# Patient Record
Sex: Female | Born: 1985 | Race: Black or African American | Hispanic: No | Marital: Single | Smoking: Former smoker
Health system: Southern US, Community
[De-identification: ages and names within clinical notes are randomized; demographics above are authoritative.]

## PROBLEM LIST (undated history)

## (undated) DIAGNOSIS — F419 Anxiety disorder, unspecified: Secondary | ICD-10-CM

## (undated) DIAGNOSIS — K219 Gastro-esophageal reflux disease without esophagitis: Secondary | ICD-10-CM

## (undated) HISTORY — PX: FINGER SURGERY: SHX640

## (undated) HISTORY — PX: EYE SURGERY: SHX253

## (undated) HISTORY — DX: Anxiety disorder, unspecified: F41.9

## (undated) HISTORY — DX: Gastro-esophageal reflux disease without esophagitis: K21.9

---

## 2011-04-16 DIAGNOSIS — K089 Disorder of teeth and supporting structures, unspecified: Secondary | ICD-10-CM | POA: Diagnosis not present

## 2011-04-16 DIAGNOSIS — N912 Amenorrhea, unspecified: Secondary | ICD-10-CM | POA: Diagnosis not present

## 2011-04-16 DIAGNOSIS — F17200 Nicotine dependence, unspecified, uncomplicated: Secondary | ICD-10-CM | POA: Diagnosis not present

## 2011-07-24 DIAGNOSIS — Z0142 Encounter for cervical smear to confirm findings of recent normal smear following initial abnormal smear: Secondary | ICD-10-CM | POA: Diagnosis not present

## 2011-07-24 DIAGNOSIS — Z3040 Encounter for surveillance of contraceptives, unspecified: Secondary | ICD-10-CM | POA: Diagnosis not present

## 2011-08-05 DIAGNOSIS — F17200 Nicotine dependence, unspecified, uncomplicated: Secondary | ICD-10-CM | POA: Diagnosis not present

## 2011-08-05 DIAGNOSIS — A5901 Trichomonal vulvovaginitis: Secondary | ICD-10-CM | POA: Diagnosis not present

## 2012-01-19 DIAGNOSIS — R300 Dysuria: Secondary | ICD-10-CM | POA: Diagnosis not present

## 2012-01-19 DIAGNOSIS — A499 Bacterial infection, unspecified: Secondary | ICD-10-CM | POA: Diagnosis not present

## 2012-01-19 DIAGNOSIS — N390 Urinary tract infection, site not specified: Secondary | ICD-10-CM | POA: Diagnosis not present

## 2012-03-29 DIAGNOSIS — R109 Unspecified abdominal pain: Secondary | ICD-10-CM | POA: Diagnosis not present

## 2012-03-29 DIAGNOSIS — N200 Calculus of kidney: Secondary | ICD-10-CM | POA: Diagnosis not present

## 2012-06-20 DIAGNOSIS — R296 Repeated falls: Secondary | ICD-10-CM | POA: Diagnosis not present

## 2012-06-20 DIAGNOSIS — S63509A Unspecified sprain of unspecified wrist, initial encounter: Secondary | ICD-10-CM | POA: Diagnosis not present

## 2012-07-26 DIAGNOSIS — F17200 Nicotine dependence, unspecified, uncomplicated: Secondary | ICD-10-CM | POA: Diagnosis not present

## 2012-07-26 DIAGNOSIS — N200 Calculus of kidney: Secondary | ICD-10-CM | POA: Diagnosis not present

## 2012-07-26 DIAGNOSIS — R109 Unspecified abdominal pain: Secondary | ICD-10-CM | POA: Diagnosis not present

## 2012-07-28 DIAGNOSIS — R109 Unspecified abdominal pain: Secondary | ICD-10-CM | POA: Diagnosis not present

## 2012-11-03 DIAGNOSIS — Z124 Encounter for screening for malignant neoplasm of cervix: Secondary | ICD-10-CM | POA: Diagnosis not present

## 2012-11-03 DIAGNOSIS — Z01419 Encounter for gynecological examination (general) (routine) without abnormal findings: Secondary | ICD-10-CM | POA: Diagnosis not present

## 2012-11-03 DIAGNOSIS — Z0000 Encounter for general adult medical examination without abnormal findings: Secondary | ICD-10-CM | POA: Diagnosis not present

## 2012-11-03 DIAGNOSIS — N926 Irregular menstruation, unspecified: Secondary | ICD-10-CM | POA: Diagnosis not present

## 2012-11-03 DIAGNOSIS — Z79899 Other long term (current) drug therapy: Secondary | ICD-10-CM | POA: Diagnosis not present

## 2012-11-11 DIAGNOSIS — Z3049 Encounter for surveillance of other contraceptives: Secondary | ICD-10-CM | POA: Diagnosis not present

## 2013-01-26 DIAGNOSIS — N3000 Acute cystitis without hematuria: Secondary | ICD-10-CM | POA: Diagnosis not present

## 2013-08-29 DIAGNOSIS — Z3049 Encounter for surveillance of other contraceptives: Secondary | ICD-10-CM | POA: Diagnosis not present

## 2013-10-01 DIAGNOSIS — N3090 Cystitis, unspecified without hematuria: Secondary | ICD-10-CM | POA: Diagnosis not present

## 2013-10-01 DIAGNOSIS — N390 Urinary tract infection, site not specified: Secondary | ICD-10-CM | POA: Diagnosis not present

## 2013-11-03 DIAGNOSIS — B9689 Other specified bacterial agents as the cause of diseases classified elsewhere: Secondary | ICD-10-CM | POA: Diagnosis not present

## 2013-11-03 DIAGNOSIS — N72 Inflammatory disease of cervix uteri: Secondary | ICD-10-CM | POA: Diagnosis not present

## 2013-11-03 DIAGNOSIS — N760 Acute vaginitis: Secondary | ICD-10-CM | POA: Diagnosis not present

## 2013-11-03 DIAGNOSIS — A499 Bacterial infection, unspecified: Secondary | ICD-10-CM | POA: Diagnosis not present

## 2013-11-10 DIAGNOSIS — N760 Acute vaginitis: Secondary | ICD-10-CM | POA: Diagnosis not present

## 2013-11-10 DIAGNOSIS — N3000 Acute cystitis without hematuria: Secondary | ICD-10-CM | POA: Diagnosis not present

## 2013-11-18 DIAGNOSIS — R300 Dysuria: Secondary | ICD-10-CM | POA: Diagnosis not present

## 2013-11-18 DIAGNOSIS — N390 Urinary tract infection, site not specified: Secondary | ICD-10-CM | POA: Diagnosis not present

## 2014-01-01 DIAGNOSIS — W458XXA Other foreign body or object entering through skin, initial encounter: Secondary | ICD-10-CM | POA: Diagnosis not present

## 2014-01-01 DIAGNOSIS — S0181XA Laceration without foreign body of other part of head, initial encounter: Secondary | ICD-10-CM | POA: Diagnosis not present

## 2014-02-27 DIAGNOSIS — Z202 Contact with and (suspected) exposure to infections with a predominantly sexual mode of transmission: Secondary | ICD-10-CM | POA: Diagnosis not present

## 2014-02-27 DIAGNOSIS — N3000 Acute cystitis without hematuria: Secondary | ICD-10-CM | POA: Diagnosis not present

## 2014-02-27 DIAGNOSIS — Z7251 High risk heterosexual behavior: Secondary | ICD-10-CM | POA: Diagnosis not present

## 2014-07-24 DIAGNOSIS — N760 Acute vaginitis: Secondary | ICD-10-CM | POA: Diagnosis not present

## 2015-01-16 DIAGNOSIS — Z0000 Encounter for general adult medical examination without abnormal findings: Secondary | ICD-10-CM | POA: Diagnosis not present

## 2015-01-16 DIAGNOSIS — Z01419 Encounter for gynecological examination (general) (routine) without abnormal findings: Secondary | ICD-10-CM | POA: Diagnosis not present

## 2015-01-16 DIAGNOSIS — Z6828 Body mass index (BMI) 28.0-28.9, adult: Secondary | ICD-10-CM | POA: Diagnosis not present

## 2015-05-25 DIAGNOSIS — R87810 Cervical high risk human papillomavirus (HPV) DNA test positive: Secondary | ICD-10-CM | POA: Diagnosis not present

## 2015-05-25 DIAGNOSIS — R87629 Unspecified abnormal cytological findings in specimens from vagina: Secondary | ICD-10-CM | POA: Diagnosis not present

## 2015-05-25 DIAGNOSIS — R87610 Atypical squamous cells of undetermined significance on cytologic smear of cervix (ASC-US): Secondary | ICD-10-CM | POA: Diagnosis not present

## 2015-06-01 DIAGNOSIS — N898 Other specified noninflammatory disorders of vagina: Secondary | ICD-10-CM | POA: Diagnosis not present

## 2015-10-29 DIAGNOSIS — Z3009 Encounter for other general counseling and advice on contraception: Secondary | ICD-10-CM | POA: Diagnosis not present

## 2015-10-29 DIAGNOSIS — N898 Other specified noninflammatory disorders of vagina: Secondary | ICD-10-CM | POA: Diagnosis not present

## 2015-10-31 DIAGNOSIS — Z309 Encounter for contraceptive management, unspecified: Secondary | ICD-10-CM | POA: Diagnosis not present

## 2015-10-31 DIAGNOSIS — N898 Other specified noninflammatory disorders of vagina: Secondary | ICD-10-CM | POA: Diagnosis not present

## 2015-12-23 DIAGNOSIS — S161XXA Strain of muscle, fascia and tendon at neck level, initial encounter: Secondary | ICD-10-CM | POA: Diagnosis not present

## 2016-02-12 DIAGNOSIS — Z3202 Encounter for pregnancy test, result negative: Secondary | ICD-10-CM | POA: Diagnosis not present

## 2016-02-12 DIAGNOSIS — N760 Acute vaginitis: Secondary | ICD-10-CM | POA: Diagnosis not present

## 2016-04-09 DIAGNOSIS — N911 Secondary amenorrhea: Secondary | ICD-10-CM | POA: Diagnosis not present

## 2016-04-11 DIAGNOSIS — N911 Secondary amenorrhea: Secondary | ICD-10-CM | POA: Diagnosis not present

## 2016-05-15 DIAGNOSIS — T148XXA Other injury of unspecified body region, initial encounter: Secondary | ICD-10-CM | POA: Diagnosis not present

## 2016-07-07 DIAGNOSIS — R87810 Cervical high risk human papillomavirus (HPV) DNA test positive: Secondary | ICD-10-CM | POA: Diagnosis not present

## 2016-07-07 DIAGNOSIS — R87612 Low grade squamous intraepithelial lesion on cytologic smear of cervix (LGSIL): Secondary | ICD-10-CM | POA: Diagnosis not present

## 2016-07-07 DIAGNOSIS — Z01419 Encounter for gynecological examination (general) (routine) without abnormal findings: Secondary | ICD-10-CM | POA: Diagnosis not present

## 2016-07-21 DIAGNOSIS — N912 Amenorrhea, unspecified: Secondary | ICD-10-CM | POA: Diagnosis not present

## 2016-10-10 ENCOUNTER — Ambulatory Visit (HOSPITAL_COMMUNITY)
Admission: EM | Admit: 2016-10-10 | Discharge: 2016-10-10 | Disposition: A | Discharge status: Home / Self Care | Payer: Medicare Other | Attending: Emergency Medicine | Admitting: Emergency Medicine

## 2016-10-10 ENCOUNTER — Encounter (HOSPITAL_COMMUNITY): Payer: Self-pay | Admitting: Family Medicine

## 2016-10-10 DIAGNOSIS — N760 Acute vaginitis: Secondary | ICD-10-CM | POA: Diagnosis not present

## 2016-10-10 MED ORDER — FLUCONAZOLE 200 MG PO TABS: ORAL_TABLET | ORAL | 0 refills | Status: DC

## 2016-10-10 NOTE — Discharge Instructions (Signed)
You have been screened for multiple types of infection diseases such as Gonorrhea, Chlamydia, bacteria vaginosis, and yeast. You have also been screened for Trichomonas. You will be notified of the results of these tests in 24-72 hours. If any therapy needs to be started or if your current therapy needs to be changed, you will be notified and and given instructions as to what to do. If your symptoms persist or fail to resolve, follow up with your primary care provider or return to clinic.   Based on your signs and symptoms, starting U on Diflucan, take one tablet today, wait 3 days and then take the second tablet

## 2016-10-10 NOTE — ED Triage Notes (Signed)
Pt here for vaginal itching, irritation and odor.

## 2016-10-10 NOTE — ED Provider Notes (Signed)
  [facility]   [id number] 10/10/16 Arrival Time: [place]:  1. Vaginitis and vulvovaginitis     Meds ordered this encounter  Medications  . fluconazole (DIFLUCAN) 200 MG tablet    Sig: Take one tablet today, wait 3 days, take the second tablet    Dispense:  2 tablet    Refill:  0    Order Specific Question:   Supervising Provider    Answer:   Melynda Ripple [[id number]]    Reviewed expectations re: course of current medical issues. Questions answered. Outlined signs and symptoms indicating need for more acute intervention. Patient verbalized understanding. After Visit Summary given. We'll treat with fluconazole, testing for gonorrhea, Trichomonas, BV, yeast, and chlamydia. We'll notify if any other results are positive.   SUBJECTIVE:  Monica Davidson is a 31 y.o. female who presents with complaint of Vaginal itching and odor for 3 days. She has no pelvic or abdominal pain, or pain with intercourse. No nausea, vomiting, fever, chills, or lymphadenopathy. She is sexually active, and did not use a condom with her last encounter. She has no abnormal menstrual issues, or dysuria.  ROS: As per HPI, otherwise negative.   OBJECTIVE:  Vitals:   10/10/16 1302  BP: 105/65  Pulse: (!) 51  Resp: 18  Temp: 97.8 F (36.6 C)  SpO2: 99%     General appearance: alert; no distress HEENT: normocephalic; atraumatic; conjunctivae normal; External ears normal Abdomen: soft, non-tender; no masses or organomegaly; no guarding or rebound tenderness Back: no CVA tenderness GU: Deferred, urine cytology obtained Extremities: no cyanosis or edema; symmetrical with no gross deformities Skin: warm and dry Neurologic: normal symmetric reflexes; normal gait Psychological:  alert and cooperative; normal mood and affect  No results found for this or any previous visit.  Labs Reviewed  URINE CYTOLOGY ANCILLARY ONLY    No results found.  No Known Allergies  PMHx, SurgHx,  SocialHx, Medications, and Allergies were reviewed in the Visit Navigator and updated as appropriate.      Barnet Glasgow, NP 10/10/16 1544

## 2016-10-13 ENCOUNTER — Telehealth (HOSPITAL_COMMUNITY): Payer: Self-pay | Admitting: *Deleted

## 2016-10-13 LAB — URINE CYTOLOGY ANCILLARY ONLY
Chlamydia: POSITIVE — AB
Neisseria Gonorrhea: NEGATIVE
Trichomonas: NEGATIVE

## 2016-10-13 MED ORDER — AZITHROMYCIN 500 MG PO TABS
1000.0000 mg | ORAL_TABLET | Freq: Once | ORAL | 0 refills | Status: AC
Start: 2016-10-13 — End: 2016-10-13

## 2016-10-13 NOTE — Telephone Encounter (Signed)
STD results given to patient. Education completed. Antibiotic sent to pharmacy of choice.   Information sent to [facility] Department.

## 2016-10-16 LAB — URINE CYTOLOGY ANCILLARY ONLY: CANDIDA VAGINITIS: NEGATIVE

## 2016-11-05 ENCOUNTER — Ambulatory Visit (HOSPITAL_COMMUNITY)
Admission: EM | Admit: 2016-11-05 | Discharge: 2016-11-05 | Disposition: A | Discharge status: Home / Self Care | Payer: Medicare Other | Attending: Family Medicine | Admitting: Family Medicine

## 2016-11-05 ENCOUNTER — Encounter (HOSPITAL_COMMUNITY): Payer: Self-pay | Admitting: Nurse Practitioner

## 2016-11-05 DIAGNOSIS — N3000 Acute cystitis without hematuria: Secondary | ICD-10-CM | POA: Diagnosis not present

## 2016-11-05 DIAGNOSIS — L298 Other pruritus: Secondary | ICD-10-CM | POA: Insufficient documentation

## 2016-11-05 DIAGNOSIS — N898 Other specified noninflammatory disorders of vagina: Secondary | ICD-10-CM | POA: Diagnosis not present

## 2016-11-05 DIAGNOSIS — R300 Dysuria: Secondary | ICD-10-CM | POA: Diagnosis not present

## 2016-11-05 DIAGNOSIS — Z3202 Encounter for pregnancy test, result negative: Secondary | ICD-10-CM | POA: Diagnosis not present

## 2016-11-05 LAB — POCT PREGNANCY, URINE: PREG TEST UR: NEGATIVE

## 2016-11-05 LAB — POCT URINALYSIS DIP (DEVICE)
BILIRUBIN URINE: NEGATIVE
Glucose, UA: NEGATIVE mg/dL
Hgb urine dipstick: NEGATIVE
KETONES UR: NEGATIVE mg/dL
NITRITE: NEGATIVE
Protein, ur: NEGATIVE mg/dL
Specific Gravity, Urine: >=1.03 (ref 1.005–1.030)
Urobilinogen, UA: 1 mg/dL (ref 0.0–1.0)
pH: 6.5 (ref 5.0–8.0)

## 2016-11-05 MED ORDER — FLUCONAZOLE 150 MG PO TABS: ORAL_TABLET | ORAL | 0 refills | Status: DC

## 2016-11-05 MED ORDER — SULFAMETHOXAZOLE-TRIMETHOPRIM 800-160 MG PO TABS: 1.0000 | ORAL_TABLET | Freq: Two times a day (BID) | ORAL | 0 refills | Status: AC

## 2016-11-05 NOTE — ED Triage Notes (Signed)
Pt presents with c/o vaginal itching. The itching began about 2 days ago. She also reports lower abdominal pain, dysuria. She denies fevers, nausea, vomiting, vaginal discharge.

## 2016-11-06 ENCOUNTER — Telehealth (HOSPITAL_COMMUNITY): Payer: Self-pay | Admitting: Internal Medicine

## 2016-11-06 LAB — URINE CYTOLOGY ANCILLARY ONLY
CHLAMYDIA, DNA PROBE: NEGATIVE
NEISSERIA GONORRHEA: NEGATIVE
Trichomonas: POSITIVE — AB

## 2016-11-06 MED ORDER — METRONIDAZOLE 500 MG PO TABS: 2000.0000 mg | ORAL_TABLET | Freq: Once | ORAL | 0 refills | Status: AC

## 2016-11-06 NOTE — ED Provider Notes (Signed)
  [place] [facility]   [id number] 11/05/16 Arrival Time: [place]:  Today you were diagnosed with the following: 1. Vaginal itching   2. Dysuria   3. Acute cystitis without hematuria     Meds ordered this encounter  Medications  . sulfamethoxazole-trimethoprim (BACTRIM DS,SEPTRA DS) 800-160 MG tablet    Sig: Take 1 tablet by mouth 2 (two) times daily.    Dispense:  6 tablet    Refill:  0  . fluconazole (DIFLUCAN) 150 MG tablet    Sig: Take one tablet by mouth as a single dose.    Dispense:  1 tablet    Refill:  0   Urine cytology sent. Will notify when results are available. Treating for UTI and possible yeast infection. Urine culture sent. Refrain from any sexual activity until urine cytology results are available. She may return here as needed.  Reviewed expectations re: course of current medical issues. Questions answered. Outlined signs and symptoms indicating need for more acute intervention. Patient verbalized understanding. After Visit Summary given.   SUBJECTIVE:  Monica Davidson is a 31 y.o. female who presents with complaint of vaginal itching for 2 days. Gradual onset. Mild dysuria. No pelvic or abdominal pain. No vaginal discharge. No n/v. Ambulatory without difficulty. Afebrile. Sexually active with one partner. Occasional condom use. No PMH of STI reported. Patient's last menstrual period was 11/02/2016.  ROS: As per HPI.   OBJECTIVE:  Vitals:   11/05/16 1908  BP: 109/68  Pulse: (!) 56  Resp: 16  Temp: 98.4 F (36.9 C)  TempSrc: Oral  SpO2: 98%     General appearance: alert, cooperative, appears stated age and no distress Throat: lips, mucosa, and tongue normal; teeth and gums normal Back: no CVA tenderness Abdomen: soft, non-tender; bowel sounds normal; no masses or organomegaly; no guarding or rebound tenderness GU: declines Skin: warm and dry Psychological:  Alert and cooperative. Normal mood and affect.  Results for orders  placed or performed during the hospital encounter of 11/05/16  POCT urinalysis dip (device)  Result Value Ref Range   Glucose, UA NEGATIVE NEGATIVE mg/dL   Bilirubin Urine NEGATIVE NEGATIVE   Ketones, ur NEGATIVE NEGATIVE mg/dL   Specific Gravity, Urine >=1.030 1.005 - 1.030   Hgb urine dipstick NEGATIVE NEGATIVE   pH 6.5 5.0 - 8.0   Protein, ur NEGATIVE NEGATIVE mg/dL   Urobilinogen, UA 1.0 0.0 - 1.0 mg/dL   Nitrite NEGATIVE NEGATIVE   Leukocytes, UA SMALL (A) NEGATIVE  Pregnancy, urine POC  Result Value Ref Range   Preg Test, Ur NEGATIVE NEGATIVE    Labs Reviewed  POCT URINALYSIS DIP (DEVICE) - Abnormal; Notable for the following:       Result Value   Leukocytes, UA SMALL (*)    All other components within normal limits  URINE CULTURE  POCT PREGNANCY, URINE  URINE CYTOLOGY ANCILLARY ONLY    No Known Allergies  History reviewed. No pertinent surgical history.        Vanessa Kick, MD 11/06/16 [phone]

## 2016-11-06 NOTE — Telephone Encounter (Signed)
clinical staff, please let patient know that test for trichomonas was positive.  Rx metronidazole was sent to the pharmacy of record, [facility] on [address] at [address]  Please refrain from sexual intercourse for 7 days to give the medicine time to work.  Sexual partners need to be notified and tested/treated.  Condoms may reduce risk of reinfection.  Recheck for further evaluation if symptoms are not improving.   LM

## 2016-11-07 LAB — URINE CULTURE

## 2016-11-11 LAB — URINE CYTOLOGY ANCILLARY ONLY

## 2017-02-13 DIAGNOSIS — R87612 Low grade squamous intraepithelial lesion on cytologic smear of cervix (LGSIL): Secondary | ICD-10-CM | POA: Diagnosis not present

## 2017-02-13 DIAGNOSIS — N870 Mild cervical dysplasia: Secondary | ICD-10-CM | POA: Diagnosis not present

## 2017-04-11 ENCOUNTER — Encounter (HOSPITAL_COMMUNITY): Payer: Self-pay

## 2017-04-11 ENCOUNTER — Ambulatory Visit (HOSPITAL_COMMUNITY)
Admission: EM | Admit: 2017-04-11 | Discharge: 2017-04-11 | Disposition: A | Discharge status: Home / Self Care | Payer: Medicare Other | Attending: Family Medicine | Admitting: Family Medicine

## 2017-04-11 ENCOUNTER — Other Ambulatory Visit: Payer: Self-pay

## 2017-04-11 DIAGNOSIS — L298 Other pruritus: Secondary | ICD-10-CM | POA: Diagnosis not present

## 2017-04-11 DIAGNOSIS — N760 Acute vaginitis: Secondary | ICD-10-CM | POA: Diagnosis not present

## 2017-04-11 DIAGNOSIS — F17200 Nicotine dependence, unspecified, uncomplicated: Secondary | ICD-10-CM | POA: Diagnosis not present

## 2017-04-11 DIAGNOSIS — Z3202 Encounter for pregnancy test, result negative: Secondary | ICD-10-CM | POA: Diagnosis not present

## 2017-04-11 LAB — POCT URINALYSIS DIP (DEVICE)
Bilirubin Urine: NEGATIVE
Glucose, UA: NEGATIVE mg/dL
Hgb urine dipstick: NEGATIVE
Ketones, ur: NEGATIVE mg/dL
LEUKOCYTES UA: NEGATIVE
NITRITE: NEGATIVE
PH: 5.5 (ref 5.0–8.0)
Protein, ur: NEGATIVE mg/dL
Specific Gravity, Urine: >=1.03 (ref 1.005–1.030)
Urobilinogen, UA: 1 mg/dL (ref 0.0–1.0)

## 2017-04-11 LAB — POCT PREGNANCY, URINE: PREG TEST UR: NEGATIVE

## 2017-04-11 MED ORDER — FLUCONAZOLE 150 MG PO TABS: ORAL_TABLET | ORAL | 0 refills | Status: DC

## 2017-04-11 NOTE — ED Triage Notes (Addendum)
Patient presents to [facility] for possible yeast infection, pt sttaes about 2-3 days ago she had vaginal discharge that was thick and white with slight odor but now has gone away, pt is requesting STD testing

## 2017-04-11 NOTE — Discharge Instructions (Addendum)
Your pregnancy test was negative, urine showed no signs of infection or blood.  We will go ahead and treat you for yeast infection with Diflucan.  Take 1 tablet. Please refrain from sexual intercourse for 7 days while medicines eliminating infection.   We are testing you for Gonorrhea, Chlamydia, Trichomonas, Yeast and Bacterial Vaginosis. We will call you if anything is positive and let you know if you require any further treatment. Please inform partners of any positive results.   Please return if symptoms not improving with treatment, development of fever, nausea, vomiting, abdominal pain.

## 2017-04-12 NOTE — ED Provider Notes (Signed)
[place]    CSN: [id number] Arrival date & time: 04/11/17  1617     History   Chief Complaint Chief Complaint  Patient presents with  . Vaginitis    HPI Monica Davidson is a 32 y.o. female no significant medical history presenting today with concern of vaginal itching.  She states that itching has been going on for 2 weeks.  Initially she thought it was related to the specific steps she was using.  But itching has persisted and gotten worse.  Initially she had thick white discharge but this has decreased.  Denies urinary symptoms of dysuria, increased frequency; does admit to some sensations of incomplete voiding.  She was using Depo-Provera for birth control but has not had her most recent shot.  Last menstrual period was 2 weeks ago.  Not currently with any form of birth control.  She does state that she has had yeast and BV infections in the past.  Denies fever, abdominal pain, back pain, nausea, vomiting.  HPI  History reviewed. No pertinent past medical history.  There are no active problems to display for this patient.   History reviewed. No pertinent surgical history.  OB History    No data available       Home Medications    Prior to Admission medications   Medication Sig Start Date End Date Taking? Authorizing Provider  fluconazole (DIFLUCAN) 150 MG tablet Take one tablet by mouth as a single dose. 04/11/17   Wieters, Elesa Hacker, PA-C    Family History History reviewed. No pertinent family history.  Social History Social History   Tobacco Use  . Smoking status: Current Some Day Smoker  . Smokeless tobacco: Never Used  Substance Use Topics  . Alcohol use: Yes  . Drug use: No     Allergies   Patient has no known allergies.   Review of Systems Review of Systems  Constitutional: Negative for fever.  Respiratory: Negative for shortness of breath.   Cardiovascular: Negative for chest pain.  Gastrointestinal: Negative for abdominal pain,  diarrhea, nausea and vomiting.  Genitourinary: Positive for vaginal discharge. Negative for dysuria, flank pain, genital sores, hematuria, menstrual problem, vaginal bleeding and vaginal pain.       Vaginal pruritis  Musculoskeletal: Negative for back pain.  Skin: Negative for rash.  Neurological: Negative for dizziness, light-headedness and headaches.     Physical Exam Triage Vital Signs ED Triage Vitals [04/11/17 1708]  Enc Vitals Group     BP 99/62     Pulse Rate 61     Resp 16     Temp 98.2 F (36.8 C)     Temp Source Oral     SpO2 100 %     Weight      Height      Head Circumference      Peak Flow      Pain Score      Pain Loc      Pain Edu?      Excl. in [place]?    No data found.  Updated Vital Signs BP 99/62 (BP Location: Left Arm)   Pulse 61   Temp 98.2 F (36.8 C) (Oral)   Resp 16   LMP 03/24/2017 (Approximate)   SpO2 100%   Visual Acuity Right Eye Distance:   Left Eye Distance:   Bilateral Distance:    Right Eye Near:   Left Eye Near:    Bilateral Near:     Physical Exam  Constitutional:  She appears well-developed and well-nourished. No distress.  HENT:  Head: Normocephalic and atraumatic.  Eyes: Conjunctivae are normal.  Neck: Neck supple.  Cardiovascular: Normal rate and regular rhythm.  No murmur heard. Pulmonary/Chest: Effort normal and breath sounds normal. No respiratory distress.  Abdominal: Soft. There is no tenderness.  Nontender to light and deep palpation  Genitourinary:  Genitourinary Comments: Pelvic exam deferred as patient without any pelvic pain or new rashes/lesions.  Musculoskeletal: She exhibits no edema.  Neurological: She is alert.  Skin: Skin is warm and dry.  Psychiatric: She has a normal mood and affect.  Nursing note and vitals reviewed.    UC Treatments / Results  Labs (all labs ordered are listed, but only abnormal results are displayed) Labs Reviewed  POCT URINALYSIS DIP (DEVICE)  POCT PREGNANCY, URINE    CERVICOVAGINAL ANCILLARY ONLY    EKG  EKG Interpretation None       Radiology No results found.  Procedures Procedures (including critical care time)  Medications Ordered in UC Medications - No data to display   Initial Impression / Assessment and Plan / UC Course  I have reviewed the triage vital signs and the nursing notes.  Pertinent labs & imaging results that were available during my care of the patient were reviewed by me and considered in my medical decision making (see chart for details).     Patient with  Vaginal itching.  UA negative for signs of infection and blood.  Pregnancy test negative.  Screening for STD's performed, will call to inform of positive results.  We will go ahead and treat for yeast infection with Diflucan.  Patient lacking fever, nausea, vomiting, abdominal pain, CMT; unlikely PID. Discussed strict return precautions. Patient verbalized understanding and is agreeable with plan.    Final Clinical Impressions(s) / UC Diagnoses   Final diagnoses:  Vaginitis and vulvovaginitis    ED Discharge Orders        Ordered    fluconazole (DIFLUCAN) 150 MG tablet     04/11/17 1728       Controlled Substance Prescriptions [place] Controlled Substance Registry consulted? Not Applicable   Janith Lima, [place] 04/12/17 [phone]

## 2017-04-13 LAB — CERVICOVAGINAL ANCILLARY ONLY
Bacterial vaginitis: POSITIVE — AB
CANDIDA VAGINITIS: NEGATIVE
Chlamydia: NEGATIVE
Neisseria Gonorrhea: NEGATIVE
Trichomonas: NEGATIVE

## 2017-04-18 ENCOUNTER — Telehealth (HOSPITAL_COMMUNITY): Payer: Self-pay | Admitting: Emergency Medicine

## 2017-04-18 MED ORDER — METRONIDAZOLE 500 MG PO TABS: 500.0000 mg | ORAL_TABLET | Freq: Two times a day (BID) | ORAL | 0 refills | Status: DC

## 2017-04-18 NOTE — Telephone Encounter (Signed)
-----   Message from Wynona Luna, MD sent at 04/14/2017  9:19 PM EST ----- Clinical staff, please let patient know that test for gardnerella (bacterial vaginosis) was positive.  This only needs to be treated if there are persistent symptoms, such as vaginal irritation/discharge.   If these symptoms are present, ok to send rx for metronidazole 500mg  bid x 7d #14 no refills or metronidazole vaginal gel 3.81% 1 applicatorful bid x 7d #84 no refills.  Recheck for further evaluation if symptoms are not improving.  LM

## 2017-04-18 NOTE — Telephone Encounter (Signed)
Called pt to notify of recent lab results.... Pt ID'd properly Reports feeling better and sx are subsiding Pt is taking/tolarating well meds given at visit.  Reports she wants [place] to call in Flagyl to [facility] on [address] even though sx have subsided. Rx sent per her request.  Adv pt if sx are not getting better to return or to f/u w/PCP Education on safe sex given Notified pt that lab results can be obtained through MyChart Pt verb understanding.

## 2017-12-15 ENCOUNTER — Ambulatory Visit (HOSPITAL_COMMUNITY)
Admission: EM | Admit: 2017-12-15 | Discharge: 2017-12-15 | Disposition: A | Discharge status: Home / Self Care | Payer: Medicare Other | Attending: Family Medicine | Admitting: Family Medicine

## 2017-12-15 ENCOUNTER — Encounter (HOSPITAL_COMMUNITY): Payer: Self-pay | Admitting: Emergency Medicine

## 2017-12-15 ENCOUNTER — Ambulatory Visit (HOSPITAL_COMMUNITY)
Admission: EM | Admit: 2017-12-15 | Discharge: 2017-12-15 | Disposition: A | Discharge status: Home / Self Care | Payer: Medicare Other

## 2017-12-15 DIAGNOSIS — Z3202 Encounter for pregnancy test, result negative: Secondary | ICD-10-CM

## 2017-12-15 DIAGNOSIS — F17200 Nicotine dependence, unspecified, uncomplicated: Secondary | ICD-10-CM | POA: Diagnosis not present

## 2017-12-15 DIAGNOSIS — N3090 Cystitis, unspecified without hematuria: Secondary | ICD-10-CM | POA: Diagnosis not present

## 2017-12-15 LAB — POCT PREGNANCY, URINE: Preg Test, Ur: NEGATIVE

## 2017-12-15 LAB — POCT URINALYSIS DIP (DEVICE)
Bilirubin Urine: NEGATIVE
Glucose, UA: NEGATIVE mg/dL
KETONES UR: NEGATIVE mg/dL
Nitrite: POSITIVE — AB
PH: 6.5 (ref 5.0–8.0)
PROTEIN: 30 mg/dL — AB
SPECIFIC GRAVITY, URINE: 1.025 (ref 1.005–1.030)
Urobilinogen, UA: 1 mg/dL (ref 0.0–1.0)

## 2017-12-15 MED ORDER — CEPHALEXIN 500 MG PO CAPS: 500.0000 mg | ORAL_CAPSULE | Freq: Two times a day (BID) | ORAL | 0 refills | Status: DC

## 2017-12-15 NOTE — ED Provider Notes (Signed)
[name]    ASSESSMENT & PLAN:  1. Cystitis     Meds ordered this encounter  Medications  . cephALEXin (KEFLEX) 500 MG capsule    Sig: Take 1 capsule (500 mg total) by mouth 2 (two) times daily.    Dispense:  10 capsule    Refill:  0   Urine culture sent. Will notify patient when results available. Will follow up with her PCP or here if not showing improvement over the next 48 hours, sooner if needed.  Outlined signs and symptoms indicating need for more acute intervention. Patient verbalized understanding. After Visit Summary given.  SUBJECTIVE:  Monica Davidson is a 32 y.o. female who complains of urinary frequency, urgency and dysuria for the past 1 day. No flank pain, fever, chills, abnormal vaginal discharge or bleeding. Hematuria: not present. Normal PO intake. No abdominal pain. No self treatment. Ambulatory without difficulty.  ROS: As in HPI.  OBJECTIVE:  Vitals:   12/15/17 1334  BP: (!) 107/55  Pulse: 60  Resp: 18  Temp: 97.6 F (36.4 C)  TempSrc: Oral  SpO2: 100%   Appears well, in no apparent distress. Abdomen is soft without tenderness, guarding, mass, rebound or organomegaly. No CVA tenderness or inguinal adenopathy noted.  Labs Reviewed  POCT URINALYSIS DIP (DEVICE) - Abnormal; Notable for the following components:      Result Value   Hgb urine dipstick MODERATE (*)    Protein, ur 30 (*)    Nitrite POSITIVE (*)    Leukocytes, UA SMALL (*)    All other components within normal limits  URINE CULTURE  POCT PREGNANCY, URINE    No Known Allergies  History reviewed. No pertinent past medical history. Social History   Socioeconomic History  . Marital status: Single    Spouse name: Not on file  . Number of children: Not on file  . Years of education: Not on file  . Highest education level: Not on file  Occupational History  . Not on file  Social Needs  . Financial resource strain: Not on file  . Food insecurity:    Worry: Not on  file    Inability: Not on file  . Transportation needs:    Medical: Not on file    Non-medical: Not on file  Tobacco Use  . Smoking status: Current Some Day Smoker  . Smokeless tobacco: Never Used  Substance and Sexual Activity  . Alcohol use: Yes  . Drug use: No  . Sexual activity: Yes    Birth control/protection: None  Lifestyle  . Physical activity:    Days per week: Not on file    Minutes per session: Not on file  . Stress: Not on file  Relationships  . Social connections:    Talks on phone: Not on file    Gets together: Not on file    Attends religious service: Not on file    Active member of club or organization: Not on file    Attends meetings of clubs or organizations: Not on file    Relationship status: Not on file  . Intimate partner violence:    Fear of current or ex partner: Not on file    Emotionally abused: Not on file    Physically abused: Not on file    Forced sexual activity: Not on file  Other Topics Concern  . Not on file  Social History Narrative  . Not on file   History reviewed. No pertinent family history.  Vanessa Kick, MD 12/15/17 1515

## 2017-12-15 NOTE — ED Triage Notes (Signed)
Pt sts UTI sx starting last night  

## 2017-12-17 ENCOUNTER — Telehealth (HOSPITAL_COMMUNITY): Payer: Self-pay

## 2017-12-17 LAB — URINE CULTURE

## 2017-12-17 NOTE — Telephone Encounter (Signed)
Urine culture positive for E.coli. This was treated with Keflex at ucc visit. Pt verbalized understanding.

## 2018-01-27 ENCOUNTER — Encounter (HOSPITAL_COMMUNITY): Payer: Self-pay | Admitting: Emergency Medicine

## 2018-01-27 ENCOUNTER — Ambulatory Visit (HOSPITAL_COMMUNITY)
Admission: EM | Admit: 2018-01-27 | Discharge: 2018-01-27 | Disposition: A | Discharge status: Home / Self Care | Payer: Medicare Other | Attending: Family Medicine | Admitting: Family Medicine

## 2018-01-27 DIAGNOSIS — F17200 Nicotine dependence, unspecified, uncomplicated: Secondary | ICD-10-CM | POA: Insufficient documentation

## 2018-01-27 DIAGNOSIS — N760 Acute vaginitis: Secondary | ICD-10-CM

## 2018-01-27 DIAGNOSIS — N898 Other specified noninflammatory disorders of vagina: Secondary | ICD-10-CM | POA: Diagnosis present

## 2018-01-27 LAB — POCT PREGNANCY, URINE: Preg Test, Ur: NEGATIVE

## 2018-01-27 MED ORDER — FLUCONAZOLE 150 MG PO TABS: 150.0000 mg | ORAL_TABLET | Freq: Once | ORAL | 0 refills | Status: AC

## 2018-01-27 MED ORDER — METRONIDAZOLE 500 MG PO TABS: 500.0000 mg | ORAL_TABLET | Freq: Two times a day (BID) | ORAL | 0 refills | Status: DC

## 2018-01-27 NOTE — ED Provider Notes (Signed)
[place]    CSN: [id number] Arrival date & time: 01/27/18  1652     History   Chief Complaint Chief Complaint  Patient presents with  . Vaginal Itching    HPI Monica Davidson is a 32 y.o. female no significant past medical history presenting today for evaluation of vaginal itching and irritation.  Patient stated her symptoms began Saturday and have been persistent for the past 4 days.  Denies discharge.  She notes that prior to onset she did use a new soap and feels this may have triggered her symptoms.  She has had both yeast and BV previously.  She denies concern for STDs, but would still like to be checked.  Denies abdominal pain, nausea, vomiting, fevers.  Last menstrual period was 10/21.  Does not use any form of birth control.  HPI  History reviewed. No pertinent past medical history.  There are no active problems to display for this patient.   History reviewed. No pertinent surgical history.  OB History   None      Home Medications    Prior to Admission medications   Medication Sig Start Date End Date Taking? Authorizing Provider  metroNIDAZOLE (FLAGYL) 500 MG tablet Take 1 tablet (500 mg total) by mouth 2 (two) times daily for 7 days. 01/27/18 02/03/18  Tekila Caillouet, Elesa Hacker, PA-C    Family History No family history on file.  Social History Social History   Tobacco Use  . Smoking status: Current Some Day Smoker  . Smokeless tobacco: Never Used  Substance Use Topics  . Alcohol use: Yes  . Drug use: No     Allergies   Patient has no known allergies.   Review of Systems Review of Systems  Constitutional: Negative for fever.  Respiratory: Negative for shortness of breath.   Cardiovascular: Negative for chest pain.  Gastrointestinal: Negative for abdominal pain, diarrhea, nausea and vomiting.  Genitourinary: Negative for dysuria, flank pain, genital sores, hematuria, menstrual problem, vaginal bleeding, vaginal discharge and vaginal pain.    Musculoskeletal: Negative for back pain.  Skin: Negative for rash.  Neurological: Negative for dizziness, light-headedness and headaches.     Physical Exam Triage Vital Signs ED Triage Vitals  Enc Vitals Group     BP 01/27/18 1757 103/67     Pulse Rate 01/27/18 1756 (!) 53     Resp 01/27/18 1756 16     Temp 01/27/18 1756 98.1 F (36.7 C)     Temp src --      SpO2 01/27/18 1756 100 %     Weight --      Height --      Head Circumference --      Peak Flow --      Pain Score 01/27/18 1757 0     Pain Loc --      Pain Edu? --      Excl. in [place]? --    No data found.  Updated Vital Signs BP 103/67   Pulse (!) 53   Temp 98.1 F (36.7 C)   Resp 16   LMP 12/28/2017   SpO2 100%   Visual Acuity Right Eye Distance:   Left Eye Distance:   Bilateral Distance:    Right Eye Near:   Left Eye Near:    Bilateral Near:     Physical Exam  Constitutional: She is oriented to person, place, and time. She appears well-developed and well-nourished.  No acute distress  HENT:  Head: Normocephalic and atraumatic.  Nose: Nose normal.  Eyes: Conjunctivae are normal.  Neck: Neck supple.  Cardiovascular: Normal rate.  Pulmonary/Chest: Effort normal. No respiratory distress.  Abdominal: She exhibits no distension.  Nontender to palpation of abdomen  Musculoskeletal: Normal range of motion.  Neurological: She is alert and oriented to person, place, and time.  Skin: Skin is warm and dry.  Psychiatric: She has a normal mood and affect.  Nursing note and vitals reviewed.    UC Treatments / Results  Labs (all labs ordered are listed, but only abnormal results are displayed) Labs Reviewed  POCT PREGNANCY, URINE  CERVICOVAGINAL ANCILLARY ONLY    EKG None  Radiology No results found.  Procedures Procedures (including critical care time)  Medications Ordered in UC Medications - No data to display  Initial Impression / Assessment and Plan / UC Course  I have reviewed the  triage vital signs and the nursing notes.  Pertinent labs & imaging results that were available during my care of the patient were reviewed by me and considered in my medical decision making (see chart for details).    Patient with itching and irritation, previous history of testing positive for BV.  Will treat empirically treat for both yeast and BV today.  Pregnancy test negative.  We will also check STDs.Discussed strict return precautions. Patient verbalized understanding and is agreeable with plan.  Final Clinical Impressions(s) / UC Diagnoses   Final diagnoses:  Vaginitis and vulvovaginitis     Discharge Instructions     Please take 1 tablet of Diflucan today, this will treat yeast infection.  Please start metronidazole twice daily for the next week to treat for BV.  I recommend having food on your stomach while taking this medicine.  We are testing you for Gonorrhea, Chlamydia, Trichomonas, Yeast and Bacterial Vaginosis. We will call you if anything is positive and let you know if you require any further treatment. Please inform partners of any positive results.   Please return if symptoms not improving with treatment, development of fever, nausea, vomiting, abdominal pain.     ED Prescriptions    Medication Sig Dispense Auth. Provider   metroNIDAZOLE (FLAGYL) 500 MG tablet Take 1 tablet (500 mg total) by mouth 2 (two) times daily for 7 days. 14 tablet Newel Oien C, PA-C   fluconazole (DIFLUCAN) 150 MG tablet Take 1 tablet (150 mg total) by mouth once for 1 dose. 2 tablet Orlando Thalmann C, PA-C     Controlled Substance Prescriptions [place] Controlled Substance Registry consulted? Not Applicable   Janith Lima, [place] 01/28/18 1655

## 2018-01-27 NOTE — ED Triage Notes (Signed)
Pt c/o vaginal itching and irritation for the last few days.

## 2018-01-27 NOTE — Discharge Instructions (Signed)
Please take 1 tablet of Diflucan today, this will treat yeast infection.  Please start metronidazole twice daily for the next week to treat for BV.  I recommend having food on your stomach while taking this medicine.  We are testing you for Gonorrhea, Chlamydia, Trichomonas, Yeast and Bacterial Vaginosis. We will call you if anything is positive and let you know if you require any further treatment. Please inform partners of any positive results.   Please return if symptoms not improving with treatment, development of fever, nausea, vomiting, abdominal pain.

## 2018-01-28 LAB — CERVICOVAGINAL ANCILLARY ONLY
Bacterial vaginitis: POSITIVE — AB
Candida vaginitis: POSITIVE — AB
Chlamydia: NEGATIVE
Neisseria Gonorrhea: NEGATIVE
Trichomonas: NEGATIVE

## 2018-02-03 ENCOUNTER — Telehealth (HOSPITAL_COMMUNITY): Payer: Self-pay | Admitting: Emergency Medicine

## 2018-02-03 NOTE — Telephone Encounter (Signed)
Pt called asking about her test results. All questions answered.

## 2018-09-17 ENCOUNTER — Ambulatory Visit (HOSPITAL_COMMUNITY)
Admission: EM | Admit: 2018-09-17 | Discharge: 2018-09-17 | Disposition: A | Discharge status: Home / Self Care | Payer: Medicare Other | Attending: Emergency Medicine | Admitting: Emergency Medicine

## 2018-09-17 ENCOUNTER — Encounter (HOSPITAL_COMMUNITY): Payer: Self-pay

## 2018-09-17 DIAGNOSIS — N760 Acute vaginitis: Secondary | ICD-10-CM | POA: Insufficient documentation

## 2018-09-17 MED ORDER — METRONIDAZOLE 500 MG PO TABS: 500.0000 mg | ORAL_TABLET | Freq: Two times a day (BID) | ORAL | 0 refills | Status: AC

## 2018-09-17 NOTE — ED Provider Notes (Signed)
[place]    CSN: [id number] Arrival date & time: 09/17/18  1518      History   Chief Complaint Chief Complaint  Patient presents with  . Vaginal Discharge    HPI Monica Davidson is a 33 y.o. female.   Monica Davidson presents with complaints of vaginal discharge. Started out as itchy, which has improved some, but now with odor. Yellow and clear. No urinary symptoms. No abdominal pain. No fevers. No pelvic pain. States she is no longer sexually active. Was seen and treated for similar 01/2019 and vaginal cytology positive for yeast and bv. States she did try an OTC yeast medication which she feels did not necessarily help. She just started her period as well.    ROS per HPI, negative if not otherwise mentioned.      History reviewed. No pertinent past medical history.  There are no active problems to display for this patient.   History reviewed. No pertinent surgical history.  OB History   No obstetric history on file.      Home Medications    Prior to Admission medications   Medication Sig Start Date End Date Taking? Authorizing Provider  metroNIDAZOLE (FLAGYL) 500 MG tablet Take 1 tablet (500 mg total) by mouth 2 (two) times daily for 7 days. 09/17/18 09/24/18  Zigmund Gottron, NP    Family History History reviewed. No pertinent family history.  Social History Social History   Tobacco Use  . Smoking status: Current Some Day Smoker  . Smokeless tobacco: Never Used  Substance Use Topics  . Alcohol use: Yes  . Drug use: No     Allergies   Patient has no known allergies.   Review of Systems Review of Systems   Physical Exam Triage Vital Signs ED Triage Vitals  Enc Vitals Group     BP 09/17/18 1545 101/74     Pulse Rate 09/17/18 1545 93     Resp 09/17/18 1545 16     Temp 09/17/18 1545 98.1 F (36.7 C)     Temp Source 09/17/18 1545 Oral     SpO2 09/17/18 1545 93 %     Weight --      Height --      Head Circumference --      Peak  Flow --      Pain Score 09/17/18 1546 0     Pain Loc --      Pain Edu? --      Excl. in [place]? --    No data found.  Updated Vital Signs BP 101/74 (BP Location: Right Arm)   Pulse 93   Temp 98.1 F (36.7 C) (Oral)   Resp 16   LMP 08/18/2018   SpO2 93%    Physical Exam Constitutional:      General: She is not in acute distress.    Appearance: She is well-developed.  Cardiovascular:     Rate and Rhythm: Normal rate and regular rhythm.     Heart sounds: Normal heart sounds.  Pulmonary:     Effort: Pulmonary effort is normal.     Breath sounds: Normal breath sounds.  Abdominal:     Palpations: Abdomen is soft. Abdomen is not rigid.     Tenderness: There is no abdominal tenderness. There is no guarding or rebound.  Genitourinary:    Comments: Denies sores, lesions;  no pelvic pain; gu exam deferred at this time, vaginal self swab collected.   Skin:    General: Skin  is warm and dry.  Neurological:     Mental Status: She is alert and oriented to person, place, and time.      UC Treatments / Results  Labs (all labs ordered are listed, but only abnormal results are displayed) Labs Reviewed  CERVICOVAGINAL ANCILLARY ONLY    EKG   Radiology No results found.  Procedures Procedures (including critical care time)  Medications Ordered in UC Medications - No data to display  Initial Impression / Assessment and Plan / UC Course  I have reviewed the triage vital signs and the nursing notes.  Pertinent labs & imaging results that were available during my care of the patient were reviewed by me and considered in my medical decision making (see chart for details).     BV treatment initiated pending vaginal cytology results. Will notify of any positive findings and if any changes to treatment are needed.  Patient verbalized understanding and agreeable to plan.   Final Clinical Impressions(s) / UC Diagnoses   Final diagnoses:  Acute vaginitis     Discharge  Instructions     We will start treatment for BV.  Will notify you of any positive findings and if any changes to treatment are needed. If symptoms worsen or do not improve in the next week to return to be seen or to follow up with your PCP.       ED Prescriptions    Medication Sig Dispense Auth. Provider   metroNIDAZOLE (FLAGYL) 500 MG tablet Take 1 tablet (500 mg total) by mouth 2 (two) times daily for 7 days. 14 tablet Zigmund Gottron, NP     Controlled Substance Prescriptions [place] Controlled Substance Registry consulted? Not Applicable   Zigmund Gottron, NP 09/17/18 1730

## 2018-09-17 NOTE — ED Triage Notes (Signed)
Pt C/O vaginal discharge with some itching. Pt states that the discharge started out yellow and changed to clear.

## 2018-09-17 NOTE — Discharge Instructions (Signed)
We will start treatment for BV.  Will notify you of any positive findings and if any changes to treatment are needed. If symptoms worsen or do not improve in the next week to return to be seen or to follow up with your PCP.

## 2018-09-20 LAB — CERVICOVAGINAL ANCILLARY ONLY
Bacterial vaginitis: POSITIVE — AB
Candida vaginitis: NEGATIVE
Chlamydia: NEGATIVE
Neisseria Gonorrhea: NEGATIVE
Trichomonas: NEGATIVE

## 2018-09-27 ENCOUNTER — Encounter (INDEPENDENT_AMBULATORY_CARE_PROVIDER_SITE_OTHER): Payer: Self-pay | Admitting: Primary Care

## 2018-09-27 ENCOUNTER — Ambulatory Visit (INDEPENDENT_AMBULATORY_CARE_PROVIDER_SITE_OTHER): Payer: Medicare Other | Admitting: Primary Care

## 2018-09-27 VITALS — BP 115/75 | HR 65 | Temp 97.5°F | Ht 61.5 in | Wt 162.0 lb

## 2018-09-27 DIAGNOSIS — N921 Excessive and frequent menstruation with irregular cycle: Secondary | ICD-10-CM

## 2018-09-27 DIAGNOSIS — Z6830 Body mass index (BMI) 30.0-30.9, adult: Secondary | ICD-10-CM

## 2018-09-27 DIAGNOSIS — F411 Generalized anxiety disorder: Secondary | ICD-10-CM | POA: Diagnosis not present

## 2018-09-27 DIAGNOSIS — Z7689 Persons encountering health services in other specified circumstances: Secondary | ICD-10-CM

## 2018-09-27 DIAGNOSIS — Z131 Encounter for screening for diabetes mellitus: Secondary | ICD-10-CM | POA: Diagnosis not present

## 2018-09-27 DIAGNOSIS — F5101 Primary insomnia: Secondary | ICD-10-CM

## 2018-09-27 DIAGNOSIS — E66812 Obesity, class 2: Secondary | ICD-10-CM

## 2018-09-27 DIAGNOSIS — Z23 Encounter for immunization: Secondary | ICD-10-CM | POA: Diagnosis not present

## 2018-09-27 MED ORDER — IBUPROFEN 600 MG PO TABS: 600.0000 mg | ORAL_TABLET | Freq: Three times a day (TID) | ORAL | 3 refills | Status: DC | PRN

## 2018-09-27 MED ORDER — TRAZODONE HCL 50 MG PO TABS: 25.0000 mg | ORAL_TABLET | Freq: Every evening | ORAL | 3 refills | Status: DC | PRN

## 2018-09-27 NOTE — Progress Notes (Signed)
Established Patient Office Visit  Subjective:  Patient ID: Monica Davidson, female    DOB: 09-05-1985  Age: 33 y.o. MRN: [id number]  CC:  Chief Complaint  Patient presents with  . New Patient (Initial Visit)    HPI Monica Davidson presents today to establish care. She has voiced some concerns about feeling anxious at this time is not interfering with activities of daily living. PMH: noted below.  Past Medical History:  Diagnosis Date  . Anxiety   . GERD (gastroesophageal reflux disease)     History reviewed. No pertinent surgical history.  History reviewed. No pertinent family history.  Social History   Socioeconomic History  . Marital status: Single    Spouse name: Not on file  . Number of children: Not on file  . Years of education: Not on file  . Highest education level: Not on file  Occupational History  . Not on file  Social Needs  . Financial resource strain: Not on file  . Food insecurity    Worry: Not on file    Inability: Not on file  . Transportation needs    Medical: Not on file    Non-medical: Not on file  Tobacco Use  . Smoking status: Former Smoker    Quit date: 09/27/2015    Years since quitting: 3.0  . Smokeless tobacco: Never Used  Substance and Sexual Activity  . Alcohol use: Yes  . Drug use: No  . Sexual activity: Yes    Birth control/protection: None  Lifestyle  . Physical activity    Days per week: Not on file    Minutes per session: Not on file  . Stress: Not on file  Relationships  . Social Herbalist on phone: Not on file    Gets together: Not on file    Attends religious service: Not on file    Active member of club or organization: Not on file    Attends meetings of clubs or organizations: Not on file    Relationship status: Not on file  . Intimate partner violence    Fear of current or ex partner: Not on file    Emotionally abused: Not on file    Physically abused: Not on file    Forced sexual activity: Not on file   Other Topics Concern  . Not on file  Social History Narrative  . Not on file    No outpatient medications prior to visit.   No facility-administered medications prior to visit.     No Known Allergies  ROS Review of Systems  Gastrointestinal: Positive for abdominal distention and constipation.  Genitourinary: Positive for menstrual problem.  Neurological: Positive for headaches.  Psychiatric/Behavioral: Positive for sleep disturbance.      Objective:    Physical Exam  Constitutional: She is oriented to person, place, and time. She appears well-developed and well-nourished.  HENT:  Head: Normocephalic and atraumatic.  Eyes: Pupils are equal, round, and reactive to light. EOM are normal.  Neck: Normal range of motion. Neck supple.  Pulmonary/Chest: Breath sounds normal. She is in respiratory distress.  Abdominal: Soft. Bowel sounds are normal. She exhibits distension.  Musculoskeletal: Normal range of motion.  Neurological: She is alert and oriented to person, place, and time.  Skin: Skin is warm and dry.  Psychiatric: She has a normal mood and affect.    BP 115/75 (BP Location: Left Arm, Patient Position: Sitting, Cuff Size: Normal)   Pulse 65   Temp (!) 97.5  F (36.4 C) (Tympanic)   Ht 5' 1.5" (1.562 m)   Wt 162 lb (73.5 kg)   LMP 09/17/2018 (Exact Date)   SpO2 94%   BMI 30.11 kg/m  Wt Readings from Last 3 Encounters:  09/27/18 162 lb (73.5 kg)     Health Maintenance Due  Topic Date Due  . HIV Screening  10/21/2000  . TETANUS/TDAP  10/21/2004  . PAP SMEAR-Modifier  10/22/2006    There are no preventive care reminders to display for this patient.  No results found for: TSH No results found for: WBC, HGB, HCT, MCV, PLT No results found for: NA, K, CHLORIDE, CO2, GLUCOSE, BUN, CREATININE, BILITOT, ALKPHOS, AST, ALT, PROT, ALBUMIN, CALCIUM, ANIONGAP, EGFR, GFR No results found for: CHOL No results found for: HDL No results found for: LDLCALC No results  found for: TRIG No results found for: CHOLHDL No results found for: HGBA1C    Assessment & Plan:  Monica was seen today for new patient (initial visit).  Diagnoses and all orders for this visit:  Need for diphtheria-tetanus-pertussis (Tdap) vaccine -     Tdap vaccine greater than or equal to 7yo IM  Encounter to establish care Establish primary care last seen in ED for acute vaginitis 09/17/2018 -     CBC with Differential -     CMP14+EGFR -     Lipid panel  Generalized anxiety disorder Stress/anxiety can affect your thoughts and feelings, relationships, daily activities, and physical health. Pandemic is difficulty to grasp the most important role played is safety wear mask, 6 feet distancing and no large gatherings.   Primary insomnia This is a new event unable to sleep is associated with her GAD. Prescribed Trazodone for dual roles anxiety and sleep.  Menorrhagia with irregular cycle On no birth control has irregular cycles some a few days others 7 days are greater. May use ibuprofen premenstrual, menstrual cycle for pain and may -     CBC with Differential  Screening for diabetes mellitus Foods that are high in carbohydrates are the following rice, potatoes, breads, sugars, and pastas.  Reduction in the intake (eating) will assist in lowering your blood sugars.  Class 2 severe obesity due to excess calories with serious comorbidity in adult, unspecified BMI ([place]) Obesity is 30-39 indicating an excess in caloric intake or underlining conditions. This may lead to other co-morbidities. Lifestyle modifications of diet and exercise may reduce obesity.  -     Lipid panel  Other orders -     traZODone (DESYREL) 50 MG tablet; Take 0.5-1 tablets (25-50 mg total) by mouth at bedtime as needed for sleep.    Meds ordered this encounter  Medications  . traZODone (DESYREL) 50 MG tablet    Sig: Take 0.5-1 tablets (25-50 mg total) by mouth at bedtime as needed for sleep.    Dispense:   30 tablet    Refill:  3    Follow-up: Return in about 1 week (around 10/04/2018) for Pap.    Kerin Perna, NP

## 2018-09-27 NOTE — Patient Instructions (Signed)

## 2018-09-28 LAB — CMP14+EGFR
ALT: 13 IU/L (ref 0–32)
AST: 17 IU/L (ref 0–40)
Albumin/Globulin Ratio: 1.8 (ref 1.2–2.2)
Albumin: 4.5 g/dL (ref 3.8–4.8)
Alkaline Phosphatase: 39 IU/L (ref 39–117)
BUN/Creatinine Ratio: 21 (ref 9–23)
BUN: 21 mg/dL — ABNORMAL HIGH (ref 6–20)
Bilirubin Total: 0.5 mg/dL (ref 0.0–1.2)
CO2: 22 mmol/L (ref 20–29)
Calcium: 11.3 mg/dL — ABNORMAL HIGH (ref 8.7–10.2)
Chloride: 106 mmol/L (ref 96–106)
Creatinine, Ser: 0.98 mg/dL (ref 0.57–1.00)
GFR calc Af Amer: 88 mL/min/{1.73_m2} (ref >59)
GFR calc non Af Amer: 77 mL/min/{1.73_m2} (ref >59)
Globulin, Total: 2.5 g/dL (ref 1.5–4.5)
Glucose: 81 mg/dL (ref 65–99)
Potassium: 4.5 mmol/L (ref 3.5–5.2)
Sodium: 143 mmol/L (ref 134–144)
Total Protein: 7 g/dL (ref 6.0–8.5)

## 2018-09-28 LAB — CBC WITH DIFFERENTIAL/PLATELET
Basophils Absolute: 0.1 10*3/uL (ref 0.0–0.2)
Basos: 1 %
EOS (ABSOLUTE): 0.1 10*3/uL (ref 0.0–0.4)
Eos: 1 %
Hematocrit: 38.6 % (ref 34.0–46.6)
Hemoglobin: 13.1 g/dL (ref 11.1–15.9)
Immature Grans (Abs): 0 10*3/uL (ref 0.0–0.1)
Immature Granulocytes: 0 %
Lymphocytes Absolute: 2.8 10*3/uL (ref 0.7–3.1)
Lymphs: 34 %
MCH: 31.3 pg (ref 26.6–33.0)
MCHC: 33.9 g/dL (ref 31.5–35.7)
MCV: 92 fL (ref 79–97)
Monocytes Absolute: 0.5 10*3/uL (ref 0.1–0.9)
Monocytes: 6 %
Neutrophils Absolute: 4.8 10*3/uL (ref 1.4–7.0)
Neutrophils: 58 %
Platelets: 273 10*3/uL (ref 150–450)
RBC: 4.19 x10E6/uL (ref 3.77–5.28)
RDW: 13.2 % (ref 11.7–15.4)
WBC: 8.3 10*3/uL (ref 3.4–10.8)

## 2018-09-28 LAB — LIPID PANEL
Chol/HDL Ratio: 2.6 ratio (ref 0.0–4.4)
Cholesterol, Total: 122 mg/dL (ref 100–199)
HDL: 47 mg/dL (ref >39)
LDL Calculated: 54 mg/dL (ref 0–99)
Triglycerides: 106 mg/dL (ref 0–149)
VLDL Cholesterol Cal: 21 mg/dL (ref 5–40)

## 2018-09-30 ENCOUNTER — Encounter (INDEPENDENT_AMBULATORY_CARE_PROVIDER_SITE_OTHER): Payer: Self-pay | Admitting: Primary Care

## 2018-09-30 ENCOUNTER — Other Ambulatory Visit (HOSPITAL_COMMUNITY)
Admission: RE | Admit: 2018-09-30 | Discharge: 2018-09-30 | Disposition: A | Discharge status: Home / Self Care | Payer: Medicare Other | Source: Ambulatory Visit | Attending: Primary Care | Admitting: Primary Care

## 2018-09-30 ENCOUNTER — Ambulatory Visit (INDEPENDENT_AMBULATORY_CARE_PROVIDER_SITE_OTHER): Payer: Medicare Other | Admitting: Primary Care

## 2018-09-30 ENCOUNTER — Other Ambulatory Visit: Payer: Self-pay

## 2018-09-30 VITALS — BP 107/59 | HR 49 | Temp 98.0°F | Ht 61.5 in | Wt 159.2 lb

## 2018-09-30 DIAGNOSIS — Z124 Encounter for screening for malignant neoplasm of cervix: Secondary | ICD-10-CM

## 2018-09-30 DIAGNOSIS — Z114 Encounter for screening for human immunodeficiency virus [HIV]: Secondary | ICD-10-CM | POA: Diagnosis not present

## 2018-09-30 DIAGNOSIS — N898 Other specified noninflammatory disorders of vagina: Secondary | ICD-10-CM | POA: Diagnosis not present

## 2018-09-30 DIAGNOSIS — R87810 Cervical high risk human papillomavirus (HPV) DNA test positive: Secondary | ICD-10-CM | POA: Diagnosis not present

## 2018-09-30 DIAGNOSIS — Z1151 Encounter for screening for human papillomavirus (HPV): Secondary | ICD-10-CM | POA: Insufficient documentation

## 2018-10-02 ENCOUNTER — Encounter (INDEPENDENT_AMBULATORY_CARE_PROVIDER_SITE_OTHER): Payer: Self-pay | Admitting: Primary Care

## 2018-10-02 LAB — CERVICOVAGINAL ANCILLARY ONLY
Bacterial vaginitis: NEGATIVE
Candida vaginitis: NEGATIVE
Chlamydia: NEGATIVE
Neisseria Gonorrhea: NEGATIVE
Trichomonas: NEGATIVE

## 2018-10-02 NOTE — Progress Notes (Signed)
Established Patient Office Visit  Subjective:  Patient ID: Monica Davidson, female    DOB: May 09, 1985  Age: 33 y.o. MRN: [id number]  CC:  Chief Complaint  Patient presents with  . Gynecologic Exam    HPI Monica Loor presents for a well woman exam.  Past Medical History:  Diagnosis Date  . Anxiety   . GERD (gastroesophageal reflux disease)     History reviewed. No pertinent surgical history.  History reviewed. No pertinent family history.  Social History   Socioeconomic History  . Marital status: Single    Spouse name: Not on file  . Number of children: Not on file  . Years of education: Not on file  . Highest education level: Not on file  Occupational History  . Not on file  Social Needs  . Financial resource strain: Not on file  . Food insecurity    Worry: Not on file    Inability: Not on file  . Transportation needs    Medical: Not on file    Non-medical: Not on file  Tobacco Use  . Smoking status: Former Smoker    Quit date: 09/27/2015    Years since quitting: 3.0  . Smokeless tobacco: Never Used  Substance and Sexual Activity  . Alcohol use: Yes  . Drug use: No  . Sexual activity: Yes    Birth control/protection: None  Lifestyle  . Physical activity    Days per week: Not on file    Minutes per session: Not on file  . Stress: Not on file  Relationships  . Social Herbalist on phone: Not on file    Gets together: Not on file    Attends religious service: Not on file    Active member of club or organization: Not on file    Attends meetings of clubs or organizations: Not on file    Relationship status: Not on file  . Intimate partner violence    Fear of current or ex partner: Not on file    Emotionally abused: Not on file    Physically abused: Not on file    Forced sexual activity: Not on file  Other Topics Concern  . Not on file  Social History Narrative  . Not on file    Outpatient Medications Prior to Visit  Medication Sig  Dispense Refill  . ibuprofen (ADVIL) 600 MG tablet Take 1 tablet (600 mg total) by mouth every 8 (eight) hours as needed. 60 tablet 3  . traZODone (DESYREL) 50 MG tablet Take 0.5-1 tablets (25-50 mg total) by mouth at bedtime as needed for sleep. (Patient not taking: Reported on 09/30/2018) 30 tablet 3   No facility-administered medications prior to visit.     No Known Allergies  ROS Review of Systems  All other systems reviewed and are negative.     Objective:    Physical Exam CONSTITUTIONAL: Well-developed, well-nourished female in no acute distress.  HENT:  Normocephalic, atraumatic, External right and left ear normal. Oropharynx is clear and moist EYES: Conjunctivae and EOM are normal. Pupils are equal, round, and reactive to light. No scleral icterus.  NECK: Normal range of motion, supple, no masses.  Normal thyroid.  SKIN: Skin is warm and dry. No rash noted. Not diaphoretic. No erythema. No pallor. [place]: Alert and oriented to person, place, and time. Normal reflexes, muscle tone coordination. No cranial nerve deficit noted. PSYCHIATRIC: Normal mood and affect. Normal behavior. Normal judgment and thought content. CARDIOVASCULAR: Normal heart rate  noted, regular rhythm RESPIRATORY: Clear to auscultation bilaterally. Effort and breath sounds normal, no problems with respiration noted. ABDOMEN: Soft, normal bowel sounds, no distention noted.  No tenderness, rebound or guarding.  PELVIC: Normal appearing external genitalia; normal appearing vaginal mucosa and cervix.  No abnormal discharge noted.  Pap smear obtained.  Normal uterine size, no other palpable masses, no uterine or adnexal tenderness. MUSCULOSKELETAL: Normal range of motion. No tenderness.  No cyanosis, clubbing, or edema.  2+ distal pulses.  BP (!) 107/59 (BP Location: Right Arm, Patient Position: Sitting, Cuff Size: Normal)   Pulse (!) 49   Temp 98 F (36.7 C) (Oral)   Ht 5' 1.5" (1.562 m)   Wt 159 lb 3.2 oz  (72.2 kg)   LMP 09/17/2018 (Exact Date)   SpO2 94%   BMI 29.59 kg/m  Wt Readings from Last 3 Encounters:  09/30/18 159 lb 3.2 oz (72.2 kg)  09/27/18 162 lb (73.5 kg)     Health Maintenance Due  Topic Date Due  . HIV Screening  10/21/2000  . PAP SMEAR-Modifier  10/22/2006    There are no preventive care reminders to display for this patient.  No results found for: TSH Lab Results  Component Value Date   WBC 8.3 09/27/2018   HGB 13.1 09/27/2018   HCT 38.6 09/27/2018   MCV 92 09/27/2018   PLT 273 09/27/2018   Lab Results  Component Value Date   NA 143 09/27/2018   K 4.5 09/27/2018   CO2 22 09/27/2018   GLUCOSE 81 09/27/2018   BUN 21 (H) 09/27/2018   CREATININE 0.98 09/27/2018   BILITOT 0.5 09/27/2018   ALKPHOS 39 09/27/2018   AST 17 09/27/2018   ALT 13 09/27/2018   PROT 7.0 09/27/2018   ALBUMIN 4.5 09/27/2018   CALCIUM 11.3 (H) 09/27/2018   Lab Results  Component Value Date   CHOL 122 09/27/2018   Lab Results  Component Value Date   HDL 47 09/27/2018   Lab Results  Component Value Date   LDLCALC 54 09/27/2018   Lab Results  Component Value Date   TRIG 106 09/27/2018   Lab Results  Component Value Date   CHOLHDL 2.6 09/27/2018   No results found for: HGBA1C    Assessment & Plan:   Problem List Items Addressed This Visit    None    Visit Diagnoses    Encounter for screening for HIV    -  Primary   Relevant Orders   HIV Antibody (routine testing w rflx)   Screening for cervical cancer       Relevant Orders   Cytology - PAP   Vaginal discharge       Relevant Orders   Cervicovaginal ancillary only (Completed)      No orders of the defined types were placed in this encounter.   Follow-up: Return if symptoms worsen or fail to improve.    Kerin Perna, NP

## 2018-10-04 ENCOUNTER — Telehealth (INDEPENDENT_AMBULATORY_CARE_PROVIDER_SITE_OTHER): Payer: Self-pay

## 2018-10-04 NOTE — Telephone Encounter (Signed)
Left voicemail informing patient that pap is normal and all STD are negative. Return call to [facility] at [phone] with any questions or concerns. Nat Christen, CMA

## 2018-10-04 NOTE — Telephone Encounter (Signed)
-----   Message from Kerin Perna, NP sent at 10/03/2018  7:09 AM EDT ----- Pap was normal no STD

## 2018-10-06 ENCOUNTER — Other Ambulatory Visit (INDEPENDENT_AMBULATORY_CARE_PROVIDER_SITE_OTHER): Payer: Self-pay | Admitting: Primary Care

## 2018-10-06 ENCOUNTER — Telehealth (INDEPENDENT_AMBULATORY_CARE_PROVIDER_SITE_OTHER): Payer: Self-pay | Admitting: Primary Care

## 2018-10-06 DIAGNOSIS — B977 Papillomavirus as the cause of diseases classified elsewhere: Secondary | ICD-10-CM

## 2018-10-06 LAB — CYTOLOGY - PAP
Diagnosis: NEGATIVE
HPV: DETECTED — AB

## 2018-10-06 NOTE — Telephone Encounter (Signed)
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

## 2018-10-21 ENCOUNTER — Ambulatory Visit (INDEPENDENT_AMBULATORY_CARE_PROVIDER_SITE_OTHER): Payer: Medicare Other | Admitting: Primary Care

## 2018-10-27 ENCOUNTER — Encounter: Payer: Medicare Other | Admitting: Obstetrics & Gynecology

## 2018-10-27 NOTE — Progress Notes (Deleted)
   Patient did not show up today for her scheduled appointment.   Aleem Elza, MD, FACOG Obstetrician & Gynecologist, [facility], [facility]  

## 2018-12-27 ENCOUNTER — Encounter: Payer: Self-pay | Admitting: Obstetrics and Gynecology

## 2018-12-27 ENCOUNTER — Ambulatory Visit (INDEPENDENT_AMBULATORY_CARE_PROVIDER_SITE_OTHER): Payer: Medicare Other | Admitting: Obstetrics and Gynecology

## 2018-12-27 ENCOUNTER — Other Ambulatory Visit: Payer: Self-pay

## 2018-12-27 VITALS — BP 111/72 | HR 67 | Ht 61.0 in | Wt 158.5 lb

## 2018-12-27 DIAGNOSIS — Z3202 Encounter for pregnancy test, result negative: Secondary | ICD-10-CM | POA: Diagnosis not present

## 2018-12-27 DIAGNOSIS — Z712 Person consulting for explanation of examination or test findings: Secondary | ICD-10-CM

## 2018-12-27 DIAGNOSIS — Z23 Encounter for immunization: Secondary | ICD-10-CM

## 2018-12-27 DIAGNOSIS — R87810 Cervical high risk human papillomavirus (HPV) DNA test positive: Secondary | ICD-10-CM | POA: Diagnosis not present

## 2018-12-27 LAB — POCT URINE PREGNANCY: Preg Test, Ur: NEGATIVE

## 2018-12-27 NOTE — Progress Notes (Signed)
New Pt is in the office to establish care. Pt had abnormal pap on 09-30-18 at PCP. LMP 09-21-18 Administered 1st Gardasil injection and pt tolerated well.

## 2018-12-27 NOTE — Progress Notes (Signed)
33 yo referred for re-evaluation of a normal pap smear with presence of HPV per patient request. Patient reports a history of abnormal pap smear requiring colposcopy 2 years ago.  Past Medical History:  Diagnosis Date  . Anxiety   . GERD (gastroesophageal reflux disease)    No past surgical history on file. No family history on file. Social History   Tobacco Use  . Smoking status: Former Smoker    Quit date: 09/27/2015    Years since quitting: 3.2  . Smokeless tobacco: Never Used  Substance Use Topics  . Alcohol use: Yes  . Drug use: No   ROS See pertinent in HPI  Blood pressure 111/72, pulse 67, height 5\' 1"  (1.549 m), weight 158 lb 8 oz (71.9 kg), last menstrual period 09/21/2018.  GENERAL: Well-developed, well-nourished female in no acute distress.  EXTREMITIES: No cyanosis, clubbing, or edema, 2+ distal pulses. NEURO: alert and oriented x 3  A/P 33 yo with normal pap smear and HPV positive - reviewed meaning of this result - Discussed with patient no indication for colposcopy at this time - Discussed benefits of Gardasil vaccine which patient agreed to receive - RTC prn

## 2018-12-27 NOTE — Patient Instructions (Signed)
Take vitamin B-complex to help with your immune system and melatonin 5mg  to help with sleep

## 2019-02-05 DIAGNOSIS — N390 Urinary tract infection, site not specified: Secondary | ICD-10-CM | POA: Diagnosis not present

## 2019-02-05 DIAGNOSIS — B373 Candidiasis of vulva and vagina: Secondary | ICD-10-CM | POA: Diagnosis not present

## 2019-02-08 DIAGNOSIS — N760 Acute vaginitis: Secondary | ICD-10-CM | POA: Diagnosis not present

## 2019-02-08 DIAGNOSIS — N9489 Other specified conditions associated with female genital organs and menstrual cycle: Secondary | ICD-10-CM | POA: Diagnosis not present

## 2019-02-19 DIAGNOSIS — Z20828 Contact with and (suspected) exposure to other viral communicable diseases: Secondary | ICD-10-CM | POA: Diagnosis not present

## 2019-04-25 ENCOUNTER — Other Ambulatory Visit: Payer: Self-pay

## 2019-04-25 ENCOUNTER — Encounter (INDEPENDENT_AMBULATORY_CARE_PROVIDER_SITE_OTHER): Payer: Self-pay | Admitting: Primary Care

## 2019-04-25 ENCOUNTER — Ambulatory Visit (INDEPENDENT_AMBULATORY_CARE_PROVIDER_SITE_OTHER): Payer: Medicare Other | Admitting: Primary Care

## 2019-04-25 DIAGNOSIS — N921 Excessive and frequent menstruation with irregular cycle: Secondary | ICD-10-CM | POA: Diagnosis not present

## 2019-04-25 NOTE — Progress Notes (Signed)
Virtual Visit via Telephone Note  I connected with Monica Davidson on 04/25/19 at  9:50 AM EST by telephone and verified that I am speaking with the correct person using two identifiers.   I discussed the limitations, risks, security and privacy concerns of performing an evaluation and management service by telephone and the availability of in person appointments. I also discussed with the patient that there may be a patient responsible charge related to this service. The patient expressed understanding and agreed to proceed.   History of Present Illness: Monica Betke is having tele visit she is concern that she was spotting after sex. Last menstrual cycle January first and she have had cycle the first week of this month. Asked was she pregnant unsure. She had a pregnancy test at home she will take it and call with the results. Past Medical History:  Diagnosis Date  . Anxiety   . GERD (gastroesophageal reflux disease)    Current Outpatient Medications on File Prior to Visit  Medication Sig Dispense Refill  . ibuprofen (ADVIL) 600 MG tablet Take 1 tablet (600 mg total) by mouth every 8 (eight) hours as needed. (Patient not taking: Reported on 12/27/2018) 60 tablet 3  . traZODone (DESYREL) 50 MG tablet Take 0.5-1 tablets (25-50 mg total) by mouth at bedtime as needed for sleep. (Patient not taking: Reported on 09/30/2018) 30 tablet 3   No current facility-administered medications on file prior to visit.     Observations/Objective: Review of Systems  Gastrointestinal:       Spotting with intercourse  All other systems reviewed and are negative.   Assessment and Plan: Monica was seen today for vaginal bleeding.  Diagnoses and all orders for this visit:  Menorrhagia with irregular cycle LMP January 1,2021 and was due Febuary 1,spotting after  having  Sex asked to take pregnancy test uses no birth control.  Patient went and purchased a pregnancy test and results were negative.    Follow Up  Instructions:    I discussed the assessment and treatment plan with the patient. The patient was provided an opportunity to ask questions and all were answered. The patient agreed with the plan and demonstrated an understanding of the instructions.   The patient was advised to call back or seek an in-person evaluation if the symptoms worsen or if the condition fails to improve as anticipated.  I provided 8 minutes of non-face-to-face time during this encounter.   Kerin Perna, NP

## 2019-05-05 ENCOUNTER — Ambulatory Visit (INDEPENDENT_AMBULATORY_CARE_PROVIDER_SITE_OTHER): Payer: Medicare Other | Admitting: Primary Care

## 2019-09-30 ENCOUNTER — Other Ambulatory Visit (HOSPITAL_COMMUNITY)
Admission: RE | Admit: 2019-09-30 | Discharge: 2019-09-30 | Disposition: A | Discharge status: Home / Self Care | Payer: Medicare Other | Source: Ambulatory Visit | Attending: Primary Care | Admitting: Primary Care

## 2019-09-30 ENCOUNTER — Ambulatory Visit (INDEPENDENT_AMBULATORY_CARE_PROVIDER_SITE_OTHER): Payer: Medicare Other | Admitting: Primary Care

## 2019-09-30 ENCOUNTER — Other Ambulatory Visit: Payer: Self-pay

## 2019-09-30 ENCOUNTER — Encounter (INDEPENDENT_AMBULATORY_CARE_PROVIDER_SITE_OTHER): Payer: Self-pay | Admitting: Primary Care

## 2019-09-30 VITALS — BP 95/64 | HR 51 | Temp 98.0°F | Ht 61.0 in | Wt 164.0 lb

## 2019-09-30 DIAGNOSIS — Z114 Encounter for screening for human immunodeficiency virus [HIV]: Secondary | ICD-10-CM

## 2019-09-30 DIAGNOSIS — N921 Excessive and frequent menstruation with irregular cycle: Secondary | ICD-10-CM

## 2019-09-30 DIAGNOSIS — N898 Other specified noninflammatory disorders of vagina: Secondary | ICD-10-CM | POA: Diagnosis not present

## 2019-09-30 DIAGNOSIS — K219 Gastro-esophageal reflux disease without esophagitis: Secondary | ICD-10-CM

## 2019-09-30 DIAGNOSIS — Z124 Encounter for screening for malignant neoplasm of cervix: Secondary | ICD-10-CM

## 2019-09-30 DIAGNOSIS — Z1151 Encounter for screening for human papillomavirus (HPV): Secondary | ICD-10-CM | POA: Insufficient documentation

## 2019-09-30 MED ORDER — OMEPRAZOLE 20 MG PO CPDR: 20.0000 mg | DELAYED_RELEASE_CAPSULE | Freq: Every day | ORAL | 3 refills | Status: DC

## 2019-09-30 NOTE — Progress Notes (Signed)
Established Patient Office Visit  Subjective:  Patient ID: Monica Davidson, female    DOB: [birth year]  Age: 34 y.o. MRN: [id number]  CC:  Chief Complaint  Patient presents with   Annual Exam    Pt. is here for a physical.   Gynecologic Exam    Pt. is here for a pap smear.     HPI Monica Davidson presents for gynecological exam. She is concern with acid reflex.   Past Medical History:  Diagnosis Date   Anxiety    GERD (gastroesophageal reflux disease)     History reviewed. No pertinent surgical history.  History reviewed. No pertinent family history.  Social History   Socioeconomic History   Marital status: Single    Spouse name: Not on file   Number of children: Not on file   Years of education: Not on file   Highest education level: Not on file  Occupational History   Not on file  Tobacco Use   Smoking status: Former Smoker    Quit date: 09/27/2015    Years since quitting: 4.0   Smokeless tobacco: Never Used  Substance and Sexual Activity   Alcohol use: Not Currently   Drug use: No   Sexual activity: Not Currently    Birth control/protection: None  Other Topics Concern   Not on file  Social History Narrative   Not on file   Social Determinants of Health   Financial Resource Strain:    Difficulty of Paying Living Expenses:   Food Insecurity:    Worried About Charity fundraiser in the Last Year:    Arboriculturist in the Last Year:   Transportation Needs:    Film/video editor (Medical):    Lack of Transportation (Non-Medical):   Physical Activity:    Days of Exercise per Week:    Minutes of Exercise per Session:   Stress:    Feeling of Stress :   Social Connections:    Frequency of Communication with Friends and Family:    Frequency of Social Gatherings with Friends and Family:    Attends Religious Services:    Active Member of Clubs or Organizations:    Attends Music therapist:    Marital Status:    Intimate Partner Violence:    Fear of Current or Ex-Partner:    Emotionally Abused:    Physically Abused:    Sexually Abused:     Outpatient Medications Prior to Visit  Medication Sig Dispense Refill   ibuprofen (ADVIL) 600 MG tablet Take 1 tablet (600 mg total) by mouth every 8 (eight) hours as needed. (Patient not taking: Reported on 12/27/2018) 60 tablet 3   traZODone (DESYREL) 50 MG tablet Take 0.5-1 tablets (25-50 mg total) by mouth at bedtime as needed for sleep. (Patient not taking: Reported on 09/30/2018) 30 tablet 3   No facility-administered medications prior to visit.    No Known Allergies  ROS Review of Systems  All other systems reviewed and are negative.     Objective:     Wt Readings from Last 3 Encounters:  09/30/19 164 lb (74.4 kg)  12/27/18 158 lb 8 oz (71.9 kg)  09/30/18 159 lb 3.2 oz (72.2 kg)   Physical Exam  CONSTITUTIONAL: Well-developed, well-nourished obese female in no acute distress.  HENT:  Normocephalic, atraumatic, External right and left ear normal. Oropharynx is clear and moist EYES: Conjunctivae and EOM are normal. Pupils are equal, round, and reactive to light. No scleral icterus.  NECK: Normal range of motion, supple, no masses.  Normal thyroid.  SKIN: Skin is warm and dry. No rash noted. Not diaphoretic. No erythema. No pallor. Monica Davidson: Alert and oriented to person, place, and time. Normal reflexes, muscle tone coordination. No cranial nerve deficit noted. PSYCHIATRIC: Normal mood and affect. Normal behavior. Normal judgment and thought content. CARDIOVASCULAR: Normal heart rate noted, regular rhythm RESPIRATORY: Clear to auscultation bilaterally. Effort and breath sounds normal, no problems with respiration noted. BREASTS: Symmetric in size. No masses, skin changes, nipple drainage, or lymphadenopathy. ABDOMEN: Soft, normal bowel sounds, no distention noted.  No tenderness, rebound or guarding.  PELVIC: Normal appearing external  genitalia; normal appearing vaginal mucosa and cervix. Discharge noted.  Pap smear obtained.  Normal uterine size, no other palpable masses, no uterine or adnexal tenderness. MUSCULOSKELETAL: Normal range of motion. No tenderness.  No cyanosis, clubbing, or edema.  2+ distal pulses.  Health Maintenance Due  Topic Date Due   Hepatitis C Screening  Never done   COVID-19 Vaccine (1) Never done   HIV Screening  Never done    There are no preventive care reminders to display for this patient.  No results found for: TSH Lab Results  Component Value Date   WBC 8.3 09/27/2018   HGB 13.1 09/27/2018   HCT 38.6 09/27/2018   MCV 92 09/27/2018   PLT 273 09/27/2018   Lab Results  Component Value Date   NA 143 09/27/2018   K 4.5 09/27/2018   CO2 22 09/27/2018   GLUCOSE 81 09/27/2018   BUN 21 (H) 09/27/2018   CREATININE 0.98 09/27/2018   BILITOT 0.5 09/27/2018   ALKPHOS 39 09/27/2018   AST 17 09/27/2018   ALT 13 09/27/2018   PROT 7.0 09/27/2018   ALBUMIN 4.5 09/27/2018   CALCIUM 11.3 (H) 09/27/2018   Lab Results  Component Value Date   CHOL 122 09/27/2018   Lab Results  Component Value Date   HDL 47 09/27/2018   Lab Results  Component Value Date   LDLCALC 54 09/27/2018   Lab Results  Component Value Date   TRIG 106 09/27/2018   Lab Results  Component Value Date   CHOLHDL 2.6 09/27/2018   No results found for: HGBA1C    Assessment & Plan:  Monica was seen today for annual exam and gynecologic exam.  Diagnoses and all orders for this visit:  Screening for cervical cancer -     Cervicovaginal ancillary only  Vaginal discharge -     Cytology - PAP  Encounter for screening for HIV -     HIV Antibody (routine testing w rflx)  Menorrhagia with irregular cycle -     CBC with Differential -     CMP14+EGFR  Gastroesophageal reflux disease without esophagitis Discussed eating small frequent meal, reduction in acidic foods, fried foods ,spicy foods, alcohol  caffeine and tobacco and certain medications. Avoid laying down after eating 19mns-1hour, elevated head of the bed.  -     omeprazole (PRILOSEC) 20 MG capsule; Take 1 capsule (20 mg total) by mouth daily.     Meds ordered this encounter  Medications   omeprazole (PRILOSEC) 20 MG capsule    Sig: Take 1 capsule (20 mg total) by mouth daily.    Dispense:  30 capsule    Refill:  3    Follow-up: Return if symptoms worsen or fail to improve.    MKerin Perna NP

## 2019-10-01 LAB — CMP14+EGFR
ALT: 6 IU/L (ref 0–32)
AST: 14 IU/L (ref 0–40)
Albumin/Globulin Ratio: 2.2 (ref 1.2–2.2)
Albumin: 4.7 g/dL (ref 3.8–4.8)
Alkaline Phosphatase: 38 IU/L — ABNORMAL LOW (ref 48–121)
BUN/Creatinine Ratio: 10 (ref 9–23)
BUN: 11 mg/dL (ref 6–20)
Bilirubin Total: 1 mg/dL (ref 0.0–1.2)
CO2: 25 mmol/L (ref 20–29)
Calcium: 10.5 mg/dL — ABNORMAL HIGH (ref 8.7–10.2)
Chloride: 110 mmol/L — ABNORMAL HIGH (ref 96–106)
Creatinine, Ser: 1.15 mg/dL — ABNORMAL HIGH (ref 0.57–1.00)
GFR calc Af Amer: 72 mL/min/{1.73_m2} (ref >59)
GFR calc non Af Amer: 63 mL/min/{1.73_m2} (ref >59)
Globulin, Total: 2.1 g/dL (ref 1.5–4.5)
Glucose: 85 mg/dL (ref 65–99)
Potassium: 4.2 mmol/L (ref 3.5–5.2)
Sodium: 144 mmol/L (ref 134–144)
Total Protein: 6.8 g/dL (ref 6.0–8.5)

## 2019-10-01 LAB — CBC WITH DIFFERENTIAL/PLATELET
Basophils Absolute: 0.1 10*3/uL (ref 0.0–0.2)
Basos: 1 %
EOS (ABSOLUTE): 0.1 10*3/uL (ref 0.0–0.4)
Eos: 1 %
Hematocrit: 38.2 % (ref 34.0–46.6)
Hemoglobin: 12.8 g/dL (ref 11.1–15.9)
Immature Grans (Abs): 0 10*3/uL (ref 0.0–0.1)
Immature Granulocytes: 0 %
Lymphocytes Absolute: 2.2 10*3/uL (ref 0.7–3.1)
Lymphs: 40 %
MCH: 32 pg (ref 26.6–33.0)
MCHC: 33.5 g/dL (ref 31.5–35.7)
MCV: 96 fL (ref 79–97)
Monocytes Absolute: 0.4 10*3/uL (ref 0.1–0.9)
Monocytes: 7 %
Neutrophils Absolute: 2.8 10*3/uL (ref 1.4–7.0)
Neutrophils: 51 %
Platelets: 266 10*3/uL (ref 150–450)
RBC: 4 x10E6/uL (ref 3.77–5.28)
RDW: 13.2 % (ref 11.7–15.4)
WBC: 5.6 10*3/uL (ref 3.4–10.8)

## 2019-10-01 LAB — HIV ANTIBODY (ROUTINE TESTING W REFLEX): HIV Screen 4th Generation wRfx: NONREACTIVE

## 2019-10-03 ENCOUNTER — Other Ambulatory Visit (INDEPENDENT_AMBULATORY_CARE_PROVIDER_SITE_OTHER): Payer: Self-pay | Admitting: Primary Care

## 2019-10-03 DIAGNOSIS — B9689 Other specified bacterial agents as the cause of diseases classified elsewhere: Secondary | ICD-10-CM

## 2019-10-03 LAB — CERVICOVAGINAL ANCILLARY ONLY
Bacterial Vaginitis (gardnerella): POSITIVE — AB
Candida Glabrata: NEGATIVE
Candida Vaginitis: NEGATIVE
Chlamydia: NEGATIVE
Comment: NEGATIVE
Comment: NEGATIVE
Comment: NEGATIVE
Comment: NEGATIVE
Comment: NEGATIVE
Comment: NORMAL
Neisseria Gonorrhea: NEGATIVE
Trichomonas: NEGATIVE

## 2019-10-03 MED ORDER — METRONIDAZOLE 500 MG PO TABS: 500.0000 mg | ORAL_TABLET | Freq: Two times a day (BID) | ORAL | 0 refills | Status: DC

## 2019-10-07 ENCOUNTER — Other Ambulatory Visit (INDEPENDENT_AMBULATORY_CARE_PROVIDER_SITE_OTHER): Payer: Self-pay | Admitting: Primary Care

## 2019-10-07 DIAGNOSIS — B977 Papillomavirus as the cause of diseases classified elsewhere: Secondary | ICD-10-CM

## 2019-10-07 LAB — CYTOLOGY - PAP
Chlamydia: NEGATIVE
Comment: NEGATIVE
Comment: NEGATIVE
Comment: NEGATIVE
Comment: NEGATIVE
Comment: NORMAL
Diagnosis: NEGATIVE
HPV 16: NEGATIVE
HPV 18 / 45: NEGATIVE
High risk HPV: POSITIVE — AB
Neisseria Gonorrhea: NEGATIVE
Trichomonas: NEGATIVE

## 2019-10-31 ENCOUNTER — Encounter: Payer: Self-pay | Admitting: Obstetrics and Gynecology

## 2019-10-31 ENCOUNTER — Ambulatory Visit (INDEPENDENT_AMBULATORY_CARE_PROVIDER_SITE_OTHER): Payer: Medicare Other | Admitting: Obstetrics and Gynecology

## 2019-10-31 ENCOUNTER — Other Ambulatory Visit: Payer: Self-pay

## 2019-10-31 VITALS — BP 110/71 | HR 57 | Ht 61.0 in | Wt 158.5 lb

## 2019-10-31 DIAGNOSIS — B977 Papillomavirus as the cause of diseases classified elsewhere: Secondary | ICD-10-CM | POA: Insufficient documentation

## 2019-10-31 DIAGNOSIS — N926 Irregular menstruation, unspecified: Secondary | ICD-10-CM | POA: Diagnosis not present

## 2019-10-31 LAB — POCT URINE PREGNANCY: Preg Test, Ur: NEGATIVE

## 2019-10-31 NOTE — Progress Notes (Signed)
Pt is here for consult after being referred for "abnormal pap" in 09/2019. Pt reports mild vaginal spotting in between periods.

## 2019-10-31 NOTE — Progress Notes (Signed)
Patient ID: Monica Davidson, female   DOB: December 02, 1985, 34 y.o.   MRN: [id number] Pt referred d/t to HPV on pap smear HPV 16 and 18 negative Cytology negative as well Same result last year  PE AF VSS Lungs clear Heart RRR Abd soft + BS  A/P HPV +  Discussed ASCCP guidelines with pt. No indication for additional testing at this time. Continue with yearly pap smears. F/U PRN

## 2020-03-22 ENCOUNTER — Other Ambulatory Visit: Payer: Self-pay

## 2020-03-22 ENCOUNTER — Encounter (INDEPENDENT_AMBULATORY_CARE_PROVIDER_SITE_OTHER): Payer: Self-pay | Admitting: Primary Care

## 2020-03-22 ENCOUNTER — Telehealth (INDEPENDENT_AMBULATORY_CARE_PROVIDER_SITE_OTHER): Payer: Medicare HMO | Admitting: Primary Care

## 2020-03-22 DIAGNOSIS — Z7251 High risk heterosexual behavior: Secondary | ICD-10-CM | POA: Diagnosis not present

## 2020-03-22 DIAGNOSIS — N898 Other specified noninflammatory disorders of vagina: Secondary | ICD-10-CM | POA: Diagnosis not present

## 2020-03-22 NOTE — Progress Notes (Signed)
I connected with  Monica Davidson on 03/22/20 by a video enabled telemedicine application and verified that I am speaking with the correct person using two identifiers.   I discussed the limitations of evaluation and management by telemedicine. The patient expressed understanding and agreed to proceed.   Pt had a rash on back of hairline and legs she took pictures to show provider.

## 2020-03-22 NOTE — Progress Notes (Signed)
Established Patient Office Visit  Subjective:  Patient ID: Monica Davidson, female    DOB: Jan 24, 1986  Age: 35 y.o. MRN: [id number]  CC:  Chief Complaint  Patient presents with  . Rash    Back of head on lower hairline and legs    HPI [name] presents in person  for a virtual visit. She has several concerns first a rash that self limited and vaginal discharge .  Patient complains of an abnormal vaginal discharge for 4 days. Discharge described as: scant and white. Vaginal symptoms include burning and local irritation.Vulvar symptoms include discharge described as white and creamy.STI Risk: Possible STD exposure.   Other associated symptoms: discharge described as white and creamy.Menstrual pattern: She had been bleeding regularly. Contraception: none.  She reports no changes in soaps, detergents coinciding with the onset of her symptoms.  She has not previously self treated or been under treatment by another provider for these symptoms.   Past Medical History:  Diagnosis Date  . Anxiety   . GERD (gastroesophageal reflux disease)     No past surgical history on file.  No family history on file.  Social History   Socioeconomic History  . Marital status: Single    Spouse name: Not on file  . Number of children: Not on file  . Years of education: Not on file  . Highest education level: Not on file  Occupational History  . Not on file  Tobacco Use  . Smoking status: Former Smoker    Quit date: 09/27/2015    Years since quitting: 4.4  . Smokeless tobacco: Never Used  Substance and Sexual Activity  . Alcohol use: Not Currently  . Drug use: No  . Sexual activity: Not Currently    Birth control/protection: None, Condom  Other Topics Concern  . Not on file  Social History Narrative  . Not on file   Social Determinants of Health   Financial Resource Strain: Not on file  Food Insecurity: Not on file  Transportation Needs: Not on file  Physical Activity: Not on file   Stress: Not on file  Social Connections: Not on file  Intimate Partner Violence: Not on file    No outpatient medications prior to visit.   No facility-administered medications prior to visit.    No Known Allergies  ROS Review of Systems    Objective:    Physical Exam  LMP 02/08/2020  Wt Readings from Last 3 Encounters:  10/31/19 158 lb 8 oz (71.9 kg)  09/30/19 164 lb (74.4 kg)  12/27/18 158 lb 8 oz (71.9 kg)     Health Maintenance Due  Topic Date Due  . Hepatitis C Screening  Never done  . COVID-19 Vaccine (1) Never done    There are no preventive care reminders to display for this patient.  No results found for: TSH Lab Results  Component Value Date   WBC 5.6 09/30/2019   HGB 12.8 09/30/2019   HCT 38.2 09/30/2019   MCV 96 09/30/2019   PLT 266 09/30/2019   Lab Results  Component Value Date   NA 144 09/30/2019   K 4.2 09/30/2019   CO2 25 09/30/2019   GLUCOSE 85 09/30/2019   BUN 11 09/30/2019   CREATININE 1.15 (H) 09/30/2019   BILITOT 1.0 09/30/2019   ALKPHOS 38 (L) 09/30/2019   AST 14 09/30/2019   ALT 6 09/30/2019   PROT 6.8 09/30/2019   ALBUMIN 4.7 09/30/2019   CALCIUM 10.5 (H) 09/30/2019   Lab  Results  Component Value Date   CHOL 122 09/27/2018   Lab Results  Component Value Date   HDL 47 09/27/2018   Lab Results  Component Value Date   LDLCALC 54 09/27/2018   Lab Results  Component Value Date   TRIG 106 09/27/2018   Lab Results  Component Value Date   CHOLHDL 2.6 09/27/2018   No results found for: HGBA1C    Assessment & Plan:   Monica was seen today for rash.  Diagnoses and all orders for this visit:  Vaginal discharge -     Cervicovaginal ancillary only  Unprotected sexual intercourse -     POCT urine pregnancy    Follow-up: No follow-ups on file.    Kerin Perna, NP

## 2020-03-23 ENCOUNTER — Other Ambulatory Visit (HOSPITAL_COMMUNITY)
Admission: RE | Admit: 2020-03-23 | Discharge: 2020-03-23 | Disposition: A | Discharge status: Home / Self Care | Payer: Medicare HMO | Source: Ambulatory Visit | Attending: Primary Care | Admitting: Primary Care

## 2020-03-23 DIAGNOSIS — N898 Other specified noninflammatory disorders of vagina: Secondary | ICD-10-CM | POA: Insufficient documentation

## 2020-03-26 ENCOUNTER — Other Ambulatory Visit (INDEPENDENT_AMBULATORY_CARE_PROVIDER_SITE_OTHER): Payer: Self-pay | Admitting: Primary Care

## 2020-03-26 DIAGNOSIS — N760 Acute vaginitis: Secondary | ICD-10-CM

## 2020-03-26 DIAGNOSIS — B9689 Other specified bacterial agents as the cause of diseases classified elsewhere: Secondary | ICD-10-CM

## 2020-03-26 LAB — CERVICOVAGINAL ANCILLARY ONLY
Bacterial Vaginitis (gardnerella): POSITIVE — AB
Candida Glabrata: NEGATIVE
Candida Vaginitis: NEGATIVE
Chlamydia: NEGATIVE
Comment: NEGATIVE
Comment: NEGATIVE
Comment: NEGATIVE
Comment: NEGATIVE
Comment: NEGATIVE
Comment: NORMAL
Neisseria Gonorrhea: NEGATIVE
Trichomonas: NEGATIVE

## 2020-03-26 MED ORDER — METRONIDAZOLE 500 MG PO TABS: 500.0000 mg | ORAL_TABLET | Freq: Two times a day (BID) | ORAL | 0 refills | Status: AC

## 2020-05-04 ENCOUNTER — Ambulatory Visit (INDEPENDENT_AMBULATORY_CARE_PROVIDER_SITE_OTHER): Payer: Medicare HMO | Admitting: Primary Care

## 2020-05-04 ENCOUNTER — Encounter (INDEPENDENT_AMBULATORY_CARE_PROVIDER_SITE_OTHER): Payer: Self-pay | Admitting: Primary Care

## 2020-05-04 ENCOUNTER — Other Ambulatory Visit: Payer: Self-pay

## 2020-05-04 VITALS — BP 117/74 | HR 59 | Temp 97.3°F | Ht 61.0 in | Wt 167.0 lb

## 2020-05-04 DIAGNOSIS — S6992XA Unspecified injury of left wrist, hand and finger(s), initial encounter: Secondary | ICD-10-CM
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
## 2020-05-04 NOTE — Patient Instructions (Signed)
Finger Sprain, Adult A finger sprain is a tear or stretch in a ligament in your finger. Ligaments are tissues that connect bones to each other. Follow these instructions at home: If you have a splint:  Do not put pressure on any part of the splint until it is fully hardened. This may take many hours.  Wear the splint as told by your doctor. Take it off only as told by your doctor.  Loosen the splint if your fingers tingle, lose feeling (get numb), or turn cold and blue.  Keep the splint clean.  If the splint is not waterproof: ? Do not let it get wet. ? Cover it with a watertight covering when you take a bath or a shower.   If you have a cast:  Do not put pressure on any part of the cast until it is fully hardened. This may take many hours.  Do not stick anything inside the cast to scratch your skin.  Check the skin around the cast every day. Tell your doctor about any concerns.  You may put lotion on dry skin around the edges of the cast. Do not put lotion on the skin under the cast.  Keep the cast clean.  If the cast is not waterproof: ? Do not let it get wet. ? Cover it with a watertight covering when you take a bath or a shower. Managing pain, stiffness, and swelling  If directed, put ice on the injured area: ? If you have a removable splint, take it off as told by your doctor. ? Put ice in a plastic bag. ? Place a towel between your skin and the bag or between your cast and the bag. ? Leave the ice on for 20 minutes, 2-3 times a day.  Gently move your fingers often to avoid stiffness and to lessen swelling.  Raise (elevate) the injured area above the level of your heart while you are sitting or lying down. Medicines  Take over-the-counter and prescription medicines only as told by your doctor.  Do not drive or use heavy machinery while taking prescription pain medicine. General instructions  Keep any bandages (dressings) dry until your doctor says they can be  taken off.  Do exercises as told by your doctor or physical therapist.  Do not wear rings on your injured finger.  Keep all follow-up visits as told by your doctor. This is important. Get help right away if:  Your pain is not helped by medicine.  Your bruising or swelling gets worse.  Your splint or cast is damaged.  You lose feeling in your finger.  Your finger turns blue.  Your finger feels colder than normal when you touch it.  You have a fever. Summary  A finger sprain is a tear or stretch in a ligament in your finger. Ligaments are tissues that connect bones to each other.  If you have a splint, loosen the splint if your fingers tingle, lose feeling (get numb), or turn cold and blue.  Gently move your fingers often to avoid stiffness and to lessen swelling.  If directed, put ice on the injured area. This information is not intended to replace advice given to you by your health care provider. Make sure you discuss any questions you have with your health care provider. Document Revised: 02/06/2017 Document Reviewed: 05/16/2016 Elsevier Patient Education  2021 [employer] Inc[employer]  

## 2020-05-04 NOTE — Progress Notes (Signed)
Pain in pinky on left hand

## 2020-05-07 ENCOUNTER — Ambulatory Visit (INDEPENDENT_AMBULATORY_CARE_PROVIDER_SITE_OTHER): Payer: Medicare HMO | Admitting: Physician Assistant

## 2020-05-07 ENCOUNTER — Encounter: Payer: Self-pay | Admitting: Physician Assistant

## 2020-05-07 ENCOUNTER — Ambulatory Visit (INDEPENDENT_AMBULATORY_CARE_PROVIDER_SITE_OTHER): Payer: Medicare HMO

## 2020-05-07 VITALS — Ht 61.0 in | Wt 167.0 lb

## 2020-05-07 DIAGNOSIS — M79645 Pain in left finger(s): Secondary | ICD-10-CM
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
## 2020-05-10 DIAGNOSIS — S62623P Displaced fracture of medial phalanx of left middle finger, subsequent encounter for fracture with malunion: Secondary | ICD-10-CM | POA: Diagnosis not present

## 2020-05-10 DIAGNOSIS — S62629A Displaced fracture of medial phalanx of unspecified finger, initial encounter for closed fracture: Secondary | ICD-10-CM | POA: Diagnosis not present

## 2020-05-11 DIAGNOSIS — S62627A Displaced fracture of medial phalanx of left little finger, initial encounter for closed fracture: Secondary | ICD-10-CM | POA: Diagnosis not present

## 2020-05-11 DIAGNOSIS — G8918 Other acute postprocedural pain: Secondary | ICD-10-CM | POA: Diagnosis not present

## 2020-05-11 DIAGNOSIS — S62627G Displaced fracture of medial phalanx of left little finger, subsequent encounter for fracture with delayed healing: Secondary | ICD-10-CM | POA: Diagnosis not present

## 2020-05-15 DIAGNOSIS — M79645 Pain in left finger(s): Secondary | ICD-10-CM | POA: Diagnosis not present

## 2020-05-15 DIAGNOSIS — S62627A Displaced fracture of medial phalanx of left little finger, initial encounter for closed fracture: Secondary | ICD-10-CM | POA: Diagnosis not present

## 2020-05-15 DIAGNOSIS — S62629A Displaced fracture of medial phalanx of unspecified finger, initial encounter for closed fracture: Secondary | ICD-10-CM | POA: Diagnosis not present

## 2020-05-15 DIAGNOSIS — M25642 Stiffness of left hand, not elsewhere classified: Secondary | ICD-10-CM | POA: Diagnosis not present

## 2020-06-05 DIAGNOSIS — S62607A Fracture of unspecified phalanx of left little finger, initial encounter for closed fracture: Secondary | ICD-10-CM | POA: Diagnosis not present

## 2020-06-05 DIAGNOSIS — Z4789 Encounter for other orthopedic aftercare: Secondary | ICD-10-CM | POA: Diagnosis not present

## 2020-06-19 DIAGNOSIS — S62627P Displaced fracture of medial phalanx of left little finger, subsequent encounter for fracture with malunion: Secondary | ICD-10-CM | POA: Diagnosis not present

## 2020-06-19 DIAGNOSIS — Z4789 Encounter for other orthopedic aftercare: Secondary | ICD-10-CM | POA: Diagnosis not present

## 2020-07-06 ENCOUNTER — Encounter (INDEPENDENT_AMBULATORY_CARE_PROVIDER_SITE_OTHER): Payer: Self-pay | Admitting: Primary Care

## 2020-07-06 ENCOUNTER — Other Ambulatory Visit: Payer: Self-pay

## 2020-07-06 ENCOUNTER — Ambulatory Visit (INDEPENDENT_AMBULATORY_CARE_PROVIDER_SITE_OTHER): Payer: Medicare HMO | Admitting: Primary Care

## 2020-07-06 VITALS — BP 116/70 | HR 60 | Resp 16 | Wt 169.2 lb

## 2020-07-06 DIAGNOSIS — Z021 Encounter for pre-employment examination: Secondary | ICD-10-CM

## 2020-07-06 DIAGNOSIS — N912 Amenorrhea, unspecified: Secondary | ICD-10-CM | POA: Diagnosis not present

## 2020-07-06 DIAGNOSIS — E66812 Obesity, class 2: Secondary | ICD-10-CM

## 2020-07-06 DIAGNOSIS — Z0001 Encounter for general adult medical examination with abnormal findings: Secondary | ICD-10-CM

## 2020-07-06 LAB — POCT URINE PREGNANCY: Preg Test, Ur: NEGATIVE

## 2020-07-06 NOTE — Progress Notes (Signed)
[facility]  Monica Davidson is a 35 y.o. female presents to office today for employment lphysical exam examination.    Concerns today include: 1. Missed menstrual cycle   Occupation: child care, Marital status: S, Substance use: none Diet: none, Exercise: yes Refills needed today: none Immunizations needed: Flu Vaccine: no  Tdap Vaccine: yes  - every 38yrs - (<3 lifetime doses or unknown): all wounds -- look up need for Tetanus IG - (>=3 lifetime doses): clean/minor wound if >91yrs from previous; all other wounds if >21yrs from previous Zoster Vaccine: no (those >50yo, once) Pneumonia Vaccine: no (those w/ risk factors) - (<32yr) Both: Immunocompromised, cochlear implant, CSF leak, asplenic, sickle cell, Chronic Renal Failure - (<74yr) PPSV-23 only: Heart dz, lung disease, DM, tobacco abuse, alcoholism, cirrhosis/liver disease. - (>70yr): PPSV13 then PPSV23 in 6-12mths;  - (>20yr): repeat PPSV23 once if pt received prior to 35yo and 70yrs have passed  Past Medical History:  Diagnosis Date  . Anxiety   . GERD (gastroesophageal reflux disease)    Social History   Socioeconomic History  . Marital status: Single    Spouse name: Not on file  . Number of children: Not on file  . Years of education: Not on file  . Highest education level: Not on file  Occupational History  . Not on file  Tobacco Use  . Smoking status: Former Smoker    Quit date: 09/27/2015    Years since quitting: 4.7  . Smokeless tobacco: Never Used  Substance and Sexual Activity  . Alcohol use: Not Currently  . Drug use: No  . Sexual activity: Not Currently    Birth control/protection: None, Condom  Other Topics Concern  . Not on file  Social History Narrative  . Not on file   Social Determinants of Health   Financial Resource Strain: Not on file  Food Insecurity: Not on file  Transportation Needs: Not on file  Physical Activity: Not on file  Stress: Not on file  Social Connections: Not  on file  Intimate Partner Violence: Not on file   No past surgical history on file. No family history on file. No current outpatient medications on file.  No Known Allergies   ROS: Review of Systems A comprehensive review of systems was negative. menstrual cycle late    Physical exam Vitals:   07/06/20 1048  BP: 116/70  Pulse: 60  Resp: 16  SpO2: 95%  Weight: 169 lb 3.2 oz (76.7 kg)   General: Vital signs reviewed.  Patient is well-developed and well-nourished, obese female in no acute distress and cooperative with exam.  Head: Normocephalic and atraumatic. Eyes: EOMI, conjunctivae normal, no scleral icterus.  Neck: Supple, trachea midline, normal ROM, no JVD, masses, thyromegaly, or carotid bruit present.  Cardiovascular: RRR, S1 normal, S2 normal, no murmurs, gallops, or rubs. Pulmonary/Chest: Clear to auscultation bilaterally, no wheezes, rales, or rhonchi. Abdominal: Soft, non-tender, non-distended, BS +, no masses, organomegaly, or guarding present.  Musculoskeletal: No joint deformities, erythema, or stiffness, ROM full and nontender. Extremities: No lower extremity edema bilaterally,  pulses symmetric and intact bilaterally. No cyanosis or clubbing. Skin: Warm, dry and intact. No rashes or erythema. Psychiatric: Normal mood and affect. speech and behavior is normal. Cognition and memory are normal.    Assessment/ Plan: Monica Davidson here for annual physical exam.   Monica was seen today for pre-employment physical.  Diagnoses and all orders for this visit:  Amenorrhea -     POCT urine pregnancy (negative)  Class 2 severe obesity due to excess calories with serious comorbidity in adult, unspecified BMI ([place]) Obesity is 30-39 indicating an excess in caloric intake or underlining conditions. This may lead to other co-morbidities.  Discussed increased risk for diabetes, hypertension and cardiovascular disease.  Lifestyle modifications of diet and exercise may reduce  obesity.   Annual visit for general adult medical examination with abnormal findings Normal except BMI 31.97-discussed comorbidities that can be associated with this     No problem-specific Assessment & Plan notes found for this encounter.   Counseled on healthy lifestyle choices, including diet (rich in fruits, vegetables and lean meats and low in salt and simple carbohydrates) and exercise (at least 30 minutes of moderate physical activity daily).  Patient to follow up in 1 year for annual exam or sooner if needed.  The above assessment and management plan was discussed with the patient. The patient verbalized understanding of and has agreed to the management plan. Patient is aware to call the clinic if symptoms persist or worsen. Patient is aware when to return to the clinic for a follow-up visit. Patient educated on when it is appropriate to go to the emergency department.   Juluis Mire NP-C [address] [place] [phone]

## 2020-08-22 ENCOUNTER — Telehealth (INDEPENDENT_AMBULATORY_CARE_PROVIDER_SITE_OTHER): Payer: Self-pay

## 2020-08-22 ENCOUNTER — Telehealth (INDEPENDENT_AMBULATORY_CARE_PROVIDER_SITE_OTHER): Payer: Medicare Other | Admitting: Primary Care

## 2020-08-22 DIAGNOSIS — J301 Allergic rhinitis due to pollen: Secondary | ICD-10-CM

## 2020-08-22 MED ORDER — LORATADINE 10 MG PO TABS: 10.0000 mg | ORAL_TABLET | Freq: Every day | ORAL | 11 refills | Status: DC

## 2020-08-22 NOTE — Telephone Encounter (Signed)
Copied from [place] [id number]. Topic: General - Other >> Aug 22, 2020 11:37 AM Yvette Rack wrote: Reason for CRM: Pt requests that the doctor's note be faxed to her employer at fax# [phone]

## 2020-08-22 NOTE — Progress Notes (Signed)
  [facility]  Virtual Visit  I connected with Monica Davidson, on 09/02/2020 at 8:06 PM by telephone  and verified that I am speaking with the correct person using two identifiers.   Consent: I discussed the limitations, risks, security and privacy concerns of performing an evaluation and management service by telephone and the availability of in person appointments. I also discussed with the patient that there may be a patient responsible charge related to this service. The patient expressed understanding and agreed to proceed.   Location of Patient: Home  Location of Provider: [facility]   Persons participating in Telemedicine visit: Monica Davidson [name],  NP  History of Present Illness: Monica Davidson is a 35 year old cold cough, sore throat -resolved. Continue itchy nose  and watery eyes    Past Medical History:  Diagnosis Date   Anxiety    GERD (gastroesophageal reflux disease)    No Known Allergies  No current outpatient medications on file prior to visit.   No current facility-administered medications on file prior to visit.    Observations/Objective: Review of Systems  HENT:  Positive for congestion and sore throat.   Eyes:        Watery eyes  Respiratory:  Positive for cough.   Skin:  Positive for itching.  All other systems reviewed and are negative.   Assessment and Plan: Diagnoses and all orders for this visit:  Seasonal allergic rhinitis due to pollen -     loratadine (CLARITIN) 10 MG tablet; Take 1 tablet (10 mg total) by mouth daily.    Follow Up Instructions: prn   I discussed the assessment and treatment plan with the patient. The patient was provided an opportunity to ask questions and all were answered. The patient agreed with the plan and demonstrated an understanding of the instructions.   The patient was advised to call back or seek an in-person evaluation if the  symptoms worsen or if the condition fails to improve as anticipated.     I provided 10 minutes total of non-face-to-face time during this encounter including median intraservice time, reviewing previous notes, investigations, ordering medications, medical decision making, coordinating care and patient verbalized understanding at the end of the visit.  10/01/20  This note has been created with Surveyor, quantity. Any transcriptional errors are unintentional.   Kerin Perna, NP 09/02/2020, 8:06 PM

## 2020-08-22 NOTE — Telephone Encounter (Signed)
Copied from [place] [id number]. Topic: General - Inquiry >> Aug 22, 2020 11:42 AM Holley Dexter N wrote: Reason for CRM: Pt called in stating the appt she had with PCP she forgot to mention, that if she needs to go back to work, or continue to stay out, pt request a call back. Please advise

## 2020-08-23 NOTE — Telephone Encounter (Signed)
Pt was following up on the doctors note that was supposed to be faxed to her work [phone], please call pt back with an update.

## 2020-08-24 NOTE — Telephone Encounter (Signed)
Letter has been faxed to employer.

## 2020-09-21 ENCOUNTER — Ambulatory Visit (HOSPITAL_COMMUNITY)
Admission: EM | Admit: 2020-09-21 | Discharge: 2020-09-21 | Disposition: A | Discharge status: Home / Self Care | Payer: Medicare Other | Attending: Medical Oncology | Admitting: Medical Oncology

## 2020-09-21 ENCOUNTER — Other Ambulatory Visit: Payer: Self-pay

## 2020-09-21 ENCOUNTER — Encounter (HOSPITAL_COMMUNITY): Payer: Self-pay

## 2020-09-21 DIAGNOSIS — R519 Headache, unspecified: Secondary | ICD-10-CM | POA: Diagnosis present

## 2020-09-21 DIAGNOSIS — R1030 Lower abdominal pain, unspecified: Secondary | ICD-10-CM | POA: Insufficient documentation

## 2020-09-21 LAB — POCT URINALYSIS DIPSTICK, ED / UC
Bilirubin Urine: NEGATIVE
Glucose, UA: NEGATIVE mg/dL
Nitrite: NEGATIVE
Protein, ur: NEGATIVE mg/dL
Specific Gravity, Urine: >=1.03 (ref 1.005–1.030)
Urobilinogen, UA: 1 mg/dL (ref 0.0–1.0)
pH: 5.5 (ref 5.0–8.0)

## 2020-09-21 LAB — POC URINE PREG, ED: Preg Test, Ur: NEGATIVE

## 2020-09-21 NOTE — ED Triage Notes (Signed)
Patient presents to Urgent Care with complaints of migraine and abdominal pain x 2 days ago. Has a hx of GERD. Treating pain with ibuprofen.    Denies fever.

## 2020-09-21 NOTE — ED Provider Notes (Signed)
[facility]    CSN: [id number] Arrival date & time: 09/21/20  1101      History   Chief Complaint Chief Complaint  Patient presents with   Migraine   Abdominal Pain    HPI Monica Davidson is a 35 y.o. female.   HPI  Migraine: Patient reports that a few days ago she had what she believes was an episode of food poisoning.  This occurred with 1 episode of vomiting and some diarrhea.  She reports that she has not had a bowel movement since.  Because of this she feels that she is having some abdominal pains that is generalized to the suprapubic area.  She states that she developed a headache as well last night.  She took ibuprofen which helped with her abdominal pain and resolved her migraine.  She reports no headache today but is still having some abdominal discomfort.  She denies any fevers, nausea, dysuria, hematuria.  She denies having any bloody bowel movements or vomiting episodes.  She has not taken anything for symptoms today.  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 No pertinent surgical history.  OB History     Gravida  0   Para  0   Term  0   Preterm  0   AB  0   Living  0      SAB  0   IAB  0   Ectopic  0   Multiple  0   Live Births  0            Home Medications    Prior to Admission medications   Medication Sig Start Date End Date Taking? Authorizing Provider  Ascorbic Acid (VITAMIN C PO) Take by mouth.    [provider]  loratadine (CLARITIN) 10 MG tablet Take 1 tablet (10 mg total) by mouth daily. 08/22/20   Kerin Perna, NP  Multiple Vitamin (MULTIVITAMIN ADULT PO) Take by mouth.    [provider]  Multiple Vitamins-Minerals (ZINC PO) Take by mouth.     [provider]  VITAMIN D PO Take by mouth.    [provider]    Family History History reviewed. No pertinent family history.  Social History Social History   Tobacco Use   Smoking status: Former    Types: Cigarettes    Quit date: 09/27/2015    Years since quitting: 4.9   Smokeless tobacco: Never  Substance Use Topics   Alcohol use: Not Currently   Drug use: No     Allergies   Patient has no known allergies.   Review of Systems Review of Systems  As stated above in HPI Physical Exam Triage Vital Signs ED Triage Vitals  Enc Vitals Group     BP 09/21/20 1152 98/62     Pulse Rate 09/21/20 1152 61     Resp 09/21/20 1152 16     Temp 09/21/20 1152 98.4 F (36.9 C)     Temp Source 09/21/20 1152 Temporal     SpO2 09/21/20 1152 100 %     Weight --      Height --      Head Circumference --  Peak Flow --      Pain Score 09/21/20 1148 7     Pain Loc --      Pain Edu? --      Excl. in [place]? --    No data found.  Updated Vital Signs BP 98/62 (BP Location: Right Arm)   Pulse 61   Temp 98.4 F (36.9 C) (Temporal)   Resp 16   LMP 09/01/2020 (Approximate) Comment: Per pt report  SpO2 100%    Physical Exam Vitals and nursing note reviewed.  Constitutional:      General: She is not in acute distress.    Appearance: She is well-developed. She is not ill-appearing, toxic-appearing or diaphoretic.  HENT:     Head: Normocephalic and atraumatic.  Eyes:     Extraocular Movements: Extraocular movements intact.     Pupils: Pupils are equal, round, and reactive to light.  Cardiovascular:     Rate and Rhythm: Normal rate and regular rhythm.     Heart sounds: Normal heart sounds.  Pulmonary:     Effort: Pulmonary effort is normal.     Breath sounds: Normal breath sounds.  Abdominal:     General: Abdomen is flat. Bowel sounds are normal.     Palpations: Abdomen is soft. There is no hepatomegaly, splenomegaly or mass.     Tenderness: There is  no abdominal tenderness. There is no right CVA tenderness, left CVA tenderness, guarding or rebound. Negative signs include Murphy's sign, Rovsing's sign and McBurney's sign.     Hernia: No hernia is present.  Skin:    General: Skin is warm.     Coloration: Skin is not jaundiced.  Neurological:     Mental Status: She is alert and oriented to person, place, and time.     UC Treatments / Results  Labs (all labs ordered are listed, but only abnormal results are displayed) Labs Reviewed - No data to display  EKG   Radiology No results found.  Procedures Procedures (including critical care time)  Medications Ordered in UC Medications - No data to display  Initial Impression / Assessment and Plan / UC Course  I have reviewed the triage vital signs and the nursing notes.  Pertinent labs & imaging results that were available during my care of the patient were reviewed by me and considered in my medical decision making (see chart for details).     New.  Discussed that this is likely resolving gastroenteritis.  I have recommended fluids such as water along with easy bland foods to help with her symptoms.  We discussed red flag signs and symptoms.  Her urine culture is pending to ensure no sign of culture although at this point I think her abnormalities likely stem more from mild dehydration from lack of water intake today. Final Clinical Impressions(s) / UC Diagnoses   Final diagnoses:  None   Discharge Instructions   None    ED Prescriptions   None    PDMP not reviewed this encounter.   Hughie Closs, [place] 09/21/20 1317

## 2020-09-22 LAB — URINE CULTURE: Culture: NO GROWTH

## 2020-10-01 ENCOUNTER — Encounter (INDEPENDENT_AMBULATORY_CARE_PROVIDER_SITE_OTHER): Payer: Medicare Other | Admitting: Primary Care

## 2020-10-11 ENCOUNTER — Other Ambulatory Visit: Payer: Self-pay

## 2020-10-11 ENCOUNTER — Encounter (INDEPENDENT_AMBULATORY_CARE_PROVIDER_SITE_OTHER): Payer: Self-pay | Admitting: Primary Care

## 2020-10-11 ENCOUNTER — Ambulatory Visit (INDEPENDENT_AMBULATORY_CARE_PROVIDER_SITE_OTHER): Payer: Medicare Other | Admitting: Primary Care

## 2020-10-11 VITALS — BP 103/64 | HR 46 | Temp 97.5°F | Ht 61.0 in | Wt 162.4 lb

## 2020-10-11 DIAGNOSIS — N921 Excessive and frequent menstruation with irregular cycle: Secondary | ICD-10-CM

## 2020-10-11 DIAGNOSIS — Z0001 Encounter for general adult medical examination with abnormal findings: Secondary | ICD-10-CM

## 2020-10-11 DIAGNOSIS — Z131 Encounter for screening for diabetes mellitus: Secondary | ICD-10-CM

## 2020-10-11 LAB — POCT GLYCOSYLATED HEMOGLOBIN (HGB A1C): Hemoglobin A1C: 5.4 % (ref 4.0–5.6)

## 2020-10-11 NOTE — Progress Notes (Signed)
[facility]   Monica Davidson is a 35 y.o. female presents to office today for annual physical exam examination.    Concerns today include: 1. Discussed she went to Urgent care and had food poisoning -  Occupation: Pharmacist, hospital , Marital status: S, Substance use: No Diet: low carb , Exercise: walking  Last pap smear: 09/30/19 Refills needed today: no Immunizations needed: Flu Vaccine: no  Tdap Vaccine: September 27, 2018 - every 50yrs - (<3 lifetime doses or unknown): all wounds -- look up need for Tetanus IG - (>=3 lifetime doses): clean/minor wound if >70yrs from previous; all other wounds if >71yrs from previous Zoster Vaccine: no (those >50yo, once) Pneumonia Vaccine: no (those w/ risk factors) - (<51yr) Both: Immunocompromised, cochlear implant, CSF leak, asplenic, sickle cell, Chronic Renal Failure - (<46yr) PPSV-23 only: Heart dz, lung disease, DM, tobacco abuse, alcoholism, cirrhosis/liver disease. - (>74yr): PPSV13 then PPSV23 in 6-12mths;  - (>26yr): repeat PPSV23 once if pt received prior to 35yo and 65yrs have passed  Past Medical History:  Diagnosis Date   Anxiety    GERD (gastroesophageal reflux disease)    Social History   Socioeconomic History   Marital status: Single    Spouse name: Not on file   Number of children: Not on file   Years of education: Not on file   Highest education level: Not on file  Occupational History   Not on file  Tobacco Use   Smoking status: Former    Types: Cigarettes    Quit date: 09/27/2015    Years since quitting: 5.0   Smokeless tobacco: Never  Substance and Sexual Activity   Alcohol use: Not Currently   Drug use: No   Sexual activity: Not Currently    Birth control/protection: None, Condom  Other Topics Concern   Not on file  Social History Narrative   Not on file   Social Determinants of Health   Financial Resource Strain: Not  on file  Food Insecurity: Not on file  Transportation Needs: Not on file  Physical Activity: Not on file  Stress: Not on file  Social Connections: Not on file  Intimate Partner Violence: Not on file   No past surgical history on file. No family history on file.  Current Outpatient Medications:    Ascorbic Acid (VITAMIN C PO), Take by mouth., Disp: , Rfl:    loratadine (CLARITIN) 10 MG tablet, Take 1 tablet (10 mg total) by mouth daily., Disp: 30 tablet, Rfl: 11   Multiple Vitamin (MULTIVITAMIN ADULT PO), Take by mouth., Disp: , Rfl:    Multiple Vitamins-Minerals (ZINC PO), Take by mouth., Disp: , Rfl:    VITAMIN D PO, Take by mouth., Disp: , Rfl:   No Known Allergies   ROS: Review of Systems Pertinent items noted in HPI and remainder of comprehensive ROS otherwise negative.    Physical exam Patient's last menstrual period was 10/06/2020 (exact date).  Vitals:   10/11/20 1114  BP: 103/64  Pulse: (!) 46  Temp: (!) 97.5 F (36.4 C)  TempSrc: Temporal  SpO2: 95%  Weight: 162 lb 6.4 oz (73.7 kg)  Height: $Remove'5\' 1"'[id number]$  (1.549 m)   General: Vital signs reviewed.  Patient is well-developed and well-nourished, obese female in no acute distress and cooperative with exam.  Head: Normocephalic and atraumatic. Eyes: EOMI, conjunctivae normal, no scleral icterus.  Neck: Supple, trachea midline, normal ROM, no JVD, masses, thyromegaly, or carotid bruit present.  Cardiovascular: RRR, S1 normal, S2 normal, no murmurs, gallops,  or rubs. Pulmonary/Chest: Clear to auscultation bilaterally, no wheezes, rales, or rhonchi. Abdominal: Soft, non-tender, non-distended, BS +, no masses, organomegaly, or guarding present.  Musculoskeletal: No joint deformities, erythema, or stiffness, ROM full and nontender. Extremities: No lower extremity edema bilaterally,  pulses symmetric and intact bilaterally. No cyanosis or clubbing. Neurological: A&O x3, Strength is normal and symmetric bilaterally, cranial nerve  II-XII are grossly intact, no focal motor deficit, sensory intact to light touch bilaterally.  Skin: Warm, dry and intact. No rashes or erythema. Psychiatric: Normal mood and affect. speech and behavior is normal. Cognition and memory are normal.    Assessment/ Plan: Monica Davidson here for annual physical exam.  Monica was seen today for annual exam.  Diagnoses and all orders for this visit:  Screening for diabetes mellitus -     HgB A1c 5.4  Menorrhagia with irregular cycle -     CBC with Differential  Annual visit for general adult medical examination with normal findings Complete   Serum calcium elevated -     CMP14+EGFR   Counseled on healthy lifestyle choices, including diet (rich in fruits, vegetables and lean meats and low in salt and simple carbohydrates) and exercise (at least 30 minutes of moderate physical activity daily).  Patient to follow up in 1 year for annual exam or sooner if needed.  The above assessment and management plan was discussed with the patient. The patient verbalized understanding of and has agreed to the management plan. Patient is aware to call the clinic if symptoms persist or worsen. Patient is aware when to return to the clinic for a follow-up visit. Patient educated on when it is appropriate to go to the emergency department.   Juluis Mire NP-C [address] [place] [phone]

## 2020-10-11 NOTE — Patient Instructions (Signed)

## 2020-10-12 LAB — CBC WITH DIFFERENTIAL/PLATELET
Basophils Absolute: 0 10*3/uL (ref 0.0–0.2)
Basos: 1 %
EOS (ABSOLUTE): 0.1 10*3/uL (ref 0.0–0.4)
Eos: 2 %
Hematocrit: 38.8 % (ref 34.0–46.6)
Hemoglobin: 12.8 g/dL (ref 11.1–15.9)
Immature Grans (Abs): 0 10*3/uL (ref 0.0–0.1)
Immature Granulocytes: 0 %
Lymphocytes Absolute: 2.1 10*3/uL (ref 0.7–3.1)
Lymphs: 42 %
MCH: 31.2 pg (ref 26.6–33.0)
MCHC: 33 g/dL (ref 31.5–35.7)
MCV: 95 fL (ref 79–97)
Monocytes Absolute: 0.3 10*3/uL (ref 0.1–0.9)
Monocytes: 7 %
Neutrophils Absolute: 2.4 10*3/uL (ref 1.4–7.0)
Neutrophils: 48 %
Platelets: 234 10*3/uL (ref 150–450)
RBC: 4.1 x10E6/uL (ref 3.77–5.28)
RDW: 13.3 % (ref 11.7–15.4)
WBC: 5 10*3/uL (ref 3.4–10.8)

## 2020-10-12 LAB — CMP14+EGFR
ALT: 8 IU/L (ref 0–32)
AST: 13 IU/L (ref 0–40)
Albumin/Globulin Ratio: 2.4 — ABNORMAL HIGH (ref 1.2–2.2)
Albumin: 4.4 g/dL (ref 3.8–4.8)
Alkaline Phosphatase: 38 IU/L — ABNORMAL LOW (ref 44–121)
BUN/Creatinine Ratio: 10 (ref 9–23)
BUN: 9 mg/dL (ref 6–20)
Bilirubin Total: 0.5 mg/dL (ref 0.0–1.2)
CO2: 24 mmol/L (ref 20–29)
Calcium: 10.3 mg/dL — ABNORMAL HIGH (ref 8.7–10.2)
Chloride: 108 mmol/L — ABNORMAL HIGH (ref 96–106)
Creatinine, Ser: 0.92 mg/dL (ref 0.57–1.00)
Globulin, Total: 1.8 g/dL (ref 1.5–4.5)
Glucose: 89 mg/dL (ref 65–99)
Potassium: 4.4 mmol/L (ref 3.5–5.2)
Sodium: 138 mmol/L (ref 134–144)
Total Protein: 6.2 g/dL (ref 6.0–8.5)
eGFR: 84 mL/min/{1.73_m2} (ref >59)

## 2020-12-21 ENCOUNTER — Encounter (HOSPITAL_COMMUNITY): Payer: Self-pay

## 2020-12-21 ENCOUNTER — Other Ambulatory Visit: Payer: Self-pay

## 2020-12-21 ENCOUNTER — Ambulatory Visit (HOSPITAL_COMMUNITY)
Admission: EM | Admit: 2020-12-21 | Discharge: 2020-12-21 | Disposition: A | Discharge status: Home / Self Care | Payer: Medicare Other | Attending: Urgent Care | Admitting: Urgent Care

## 2020-12-21 DIAGNOSIS — R350 Frequency of micturition: Secondary | ICD-10-CM | POA: Diagnosis present

## 2020-12-21 DIAGNOSIS — Z7251 High risk heterosexual behavior: Secondary | ICD-10-CM | POA: Insufficient documentation

## 2020-12-21 LAB — POC URINE PREG, ED: Preg Test, Ur: NEGATIVE

## 2020-12-21 LAB — POCT URINALYSIS DIPSTICK, ED / UC
Bilirubin Urine: NEGATIVE
Glucose, UA: NEGATIVE mg/dL
Nitrite: NEGATIVE
Protein, ur: NEGATIVE mg/dL
Specific Gravity, Urine: 1.02 (ref 1.005–1.030)
Urobilinogen, UA: 0.2 mg/dL (ref 0.0–1.0)
pH: 5.5 (ref 5.0–8.0)

## 2020-12-21 NOTE — ED Provider Notes (Signed)
[facility]   MRN: [id number] DOB: [birth year]  Subjective:   Monica Davidson is a 35 y.o. female presenting for 1 day history of acute onset urinary urgency, urinary frequency.  Denies fever, nausea, vomiting, dysuria, hematuria, abdominal or pelvic pain.  Patient has unprotected sex with 1 female partner, would like to make sure she does not have an STI.  No vaginal discharge, genital rash.  Patient does not drink water.  She primarily drinks coffee and juice.  No current facility-administered medications for this encounter.  Current Outpatient Medications:    Ascorbic Acid (VITAMIN C PO), Take by mouth., Disp: , Rfl:    loratadine (CLARITIN) 10 MG tablet, Take 1 tablet (10 mg total) by mouth daily., Disp: 30 tablet, Rfl: 11   Multiple Vitamin (MULTIVITAMIN ADULT PO), Take by mouth., Disp: , Rfl:    Multiple Vitamins-Minerals (ZINC PO), Take by mouth., Disp: , Rfl:    VITAMIN D PO, Take by mouth., Disp: , Rfl:    No Known Allergies  Past Medical History:  Diagnosis Date   Anxiety    GERD (gastroesophageal reflux disease)      History reviewed. No pertinent surgical history.  History reviewed. No pertinent family history.  Social History   Tobacco Use   Smoking status: Former    Types: Cigarettes    Quit date: 09/27/2015    Years since quitting: 5.2   Smokeless tobacco: Never  Substance Use Topics   Alcohol use: Not Currently   Drug use: No    ROS   Objective:   Vitals: BP 112/67 (BP Location: Left Arm)   Pulse 68   Temp 98.4 F (36.9 C) (Oral)   Resp 18   SpO2 98%   Physical Exam Constitutional:      General: She is not in acute distress.    Appearance: Normal appearance. She is well-developed. She is not ill-appearing, toxic-appearing or diaphoretic.  HENT:     Head: Normocephalic and atraumatic.     Nose: Nose normal.     Mouth/Throat:     Mouth: Mucous membranes are moist.     Pharynx: Oropharynx is clear.  Eyes:     General: No  scleral icterus.       Right eye: No discharge.        Left eye: No discharge.     Extraocular Movements: Extraocular movements intact.     Conjunctiva/sclera: Conjunctivae normal.     Pupils: Pupils are equal, round, and reactive to light.  Cardiovascular:     Rate and Rhythm: Normal rate.  Pulmonary:     Effort: Pulmonary effort is normal.  Abdominal:     General: Bowel sounds are normal. There is no distension.     Palpations: Abdomen is soft. There is no mass.     Tenderness: There is no abdominal tenderness. There is no right CVA tenderness, left CVA tenderness, guarding or rebound.  Skin:    General: Skin is warm and dry.  Neurological:     General: No focal deficit present.     Mental Status: She is alert and oriented to person, place, and time.  Psychiatric:        Mood and Affect: Mood normal.        Behavior: Behavior normal.        Thought Content: Thought content normal.        Judgment: Judgment normal.    Results for orders placed or performed during the hospital encounter of  12/21/20 (from the past 24 hour(s))  POC Urinalysis dipstick     Status: Abnormal   Collection Time: 12/21/20 12:29 PM  Result Value Ref Range   Glucose, UA NEGATIVE NEGATIVE mg/dL   Bilirubin Urine NEGATIVE NEGATIVE   Ketones, ur TRACE (A) NEGATIVE mg/dL   Specific Gravity, Urine 1.020 1.005 - 1.030   Hgb urine dipstick MODERATE (A) NEGATIVE   pH 5.5 5.0 - 8.0   Protein, ur NEGATIVE NEGATIVE mg/dL   Urobilinogen, UA 0.2 0.0 - 1.0 mg/dL   Nitrite NEGATIVE NEGATIVE   Leukocytes,Ua SMALL (A) NEGATIVE  POC urine pregnancy     Status: None   Collection Time: 12/21/20 12:34 PM  Result Value Ref Range   Preg Test, Ur NEGATIVE NEGATIVE    Assessment and Plan :   PDMP not reviewed this encounter.  1. Urinary frequency   2. Unprotected sex     Low suspicion for an urinary tract infection given urinalysis and the fact the patient does not hydrate with water at all and primarily drinks  coffee, juice.  Emphasized need to start hydrating much better with water and avoid urinary irritants.  STI check pending, will treat based off of results. Counseled patient on potential for adverse effects with medications prescribed/recommended today, ER and return-to-clinic precautions discussed, patient verbalized understanding.    Jaynee Eagles, PA-C 12/21/20 1314

## 2020-12-21 NOTE — ED Triage Notes (Signed)
Pt c/o urinary urgency and frequency since yesterday. Denies pain at this time.

## 2020-12-21 NOTE — Discharge Instructions (Signed)

## 2020-12-23 LAB — URINE CULTURE: Culture: >=100000 — AB

## 2020-12-24 LAB — CERVICOVAGINAL ANCILLARY ONLY
Chlamydia: NEGATIVE
Comment: NEGATIVE
Comment: NEGATIVE
Comment: NORMAL
Neisseria Gonorrhea: NEGATIVE
Trichomonas: NEGATIVE

## 2020-12-25 ENCOUNTER — Telehealth (HOSPITAL_COMMUNITY): Payer: Self-pay | Admitting: Emergency Medicine

## 2020-12-25 MED ORDER — SULFAMETHOXAZOLE-TRIMETHOPRIM 800-160 MG PO TABS: 1.0000 | ORAL_TABLET | Freq: Two times a day (BID) | ORAL | 0 refills | Status: AC

## 2021-03-12 ENCOUNTER — Emergency Department (HOSPITAL_COMMUNITY)
Admission: EM | Admit: 2021-03-12 | Discharge: 2021-03-12 | Disposition: A | Discharge status: Home / Self Care | Payer: Medicare Other | Attending: Emergency Medicine | Admitting: Emergency Medicine

## 2021-03-12 ENCOUNTER — Emergency Department (HOSPITAL_COMMUNITY): Payer: Medicare Other

## 2021-03-12 ENCOUNTER — Ambulatory Visit (INDEPENDENT_AMBULATORY_CARE_PROVIDER_SITE_OTHER): Payer: Self-pay

## 2021-03-12 ENCOUNTER — Encounter (HOSPITAL_COMMUNITY): Payer: Self-pay | Admitting: Emergency Medicine

## 2021-03-12 ENCOUNTER — Ambulatory Visit (HOSPITAL_COMMUNITY)
Admission: EM | Admit: 2021-03-12 | Discharge: 2021-03-12 | Disposition: A | Discharge status: Home / Self Care | Payer: Commercial Managed Care - HMO | Attending: Student | Admitting: Student

## 2021-03-12 ENCOUNTER — Encounter (HOSPITAL_COMMUNITY): Payer: Self-pay | Admitting: *Deleted

## 2021-03-12 ENCOUNTER — Other Ambulatory Visit: Payer: Self-pay

## 2021-03-12 DIAGNOSIS — M545 Low back pain, unspecified: Secondary | ICD-10-CM | POA: Diagnosis not present

## 2021-03-12 DIAGNOSIS — S29019A Strain of muscle and tendon of unspecified wall of thorax, initial encounter: Secondary | ICD-10-CM | POA: Diagnosis not present

## 2021-03-12 DIAGNOSIS — H539 Unspecified visual disturbance: Secondary | ICD-10-CM

## 2021-03-12 DIAGNOSIS — R519 Headache, unspecified: Secondary | ICD-10-CM | POA: Diagnosis present

## 2021-03-12 DIAGNOSIS — M542 Cervicalgia: Secondary | ICD-10-CM | POA: Insufficient documentation

## 2021-03-12 DIAGNOSIS — H538 Other visual disturbances: Secondary | ICD-10-CM | POA: Diagnosis not present

## 2021-03-12 MED ORDER — TIZANIDINE HCL 2 MG PO TABS: 2.0000 mg | ORAL_TABLET | Freq: Three times a day (TID) | ORAL | 0 refills | Status: DC | PRN

## 2021-03-12 NOTE — Discharge Instructions (Signed)
You were seen in the emergency department today for headache and back pain.  As we discussed the CT imaging of your head was normal.  I also have low concern for any fractures in your spine.  I'd like you to pick up and take your prescribed muscle relaxer.  It has been a pleasure seeing and caring for you today and I hope you start feeling better soon!
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
## 2021-03-12 NOTE — Discharge Instructions (Addendum)
-  Start the muscle relaxer-Zanaflex (tizanidine), up to 3 times daily for muscle spasms and pain.  This can make you drowsy, so take at bedtime or when you do not need to drive or operate machinery. -You can take Tylenol up to 1000 mg 3 times daily, and ibuprofen up to 600 mg 3 times daily with food.  You can take these together, or alternate every 3-4 hours. -Heating pad
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
## 2021-03-12 NOTE — Telephone Encounter (Signed)
Pt called in, asking if she needed to sit at the ED and wait even tho wait time was 10 hours. Pt was advised ED from previous NT. Before I could advise pt, someone was calling her back.
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
## 2021-03-12 NOTE — Telephone Encounter (Signed)
Pt called back, she is asking if order was placed for CT scan. Advised pt that I cannot place an order for CT that has to come from provider. Attempted to call office but still closed for lunch so I advised pt I will send message to [facility] and see if they can get in touch with pt. Pt has been told in ED that she can have CT order placed and come back for the imaging at scheduled time rather than waiting 10 hours to be seen.
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
## 2021-03-12 NOTE — Telephone Encounter (Signed)
Patient is currently at ED. Advised that any imaging or testing needed will be ordered by ED providers.

## 2021-03-20 ENCOUNTER — Ambulatory Visit (INDEPENDENT_AMBULATORY_CARE_PROVIDER_SITE_OTHER): Payer: Self-pay | Admitting: *Deleted

## 2021-03-20 NOTE — Telephone Encounter (Signed)
°  Chief Complaint: Dizziness Symptoms: "Floaty" Frequency: One episode yesterday, still mild dizziness. Worse with turning head Pertinent Negatives: Patient denies headache, numbness,CP, SOB, no congestion Disposition: [] ED /[] Urgent Care (no appt availability in office) / [] Appointment(In office/virtual)/ []  [facility]/ [] Home Care/ [] Refused Recommended Disposition /[x] [place] [facility]/ []  Follow-up with PCP Additional Notes:       Reason for Disposition  [1] MODERATE dizziness (e.g., interferes with normal activities) AND [2] has NOT been evaluated by physician for this  (Exception: dizziness caused by heat exposure, sudden standing, or poor fluid intake)  Answer Assessment - Initial Assessment Questions 1. DESCRIPTION: "Describe your dizziness."     Floaty, "Maybe spinning" 2. LIGHTHEADED: "Do you feel lightheaded?" (e.g., somewhat faint, woozy, weak upon standing)     Yes 3. VERTIGO: "Do you feel like either you or the room is spinning or tilting?" (i.e. vertigo)     "Maybe" 4. SEVERITY: "How bad is it?"  "Do you feel like you are going to faint?" "Can you stand and walk?"   - MILD: Feels slightly dizzy, but walking normally.   - MODERATE: Feels unsteady when walking, but not falling; interferes with normal activities (e.g., school, work).   - SEVERE: Unable to walk without falling, or requires assistance to walk without falling; feels like passing out now.      Mild 5. ONSET:  "When did the dizziness begin?"     Yesterday 6. AGGRAVATING FACTORS: "Does anything make it worse?" (e.g., standing, change in head position)     Turning head fast 7. HEART RATE: "Can you tell me your heart rate?" "How many beats in 15 seconds?"  (Note: not all patients can do this)       No 8. CAUSE: "What do you think is causing the dizziness?"     Unsure 9. RECURRENT SYMPTOM: "Have you had dizziness before?" If Yes, ask: "When was the last time?" "What happened that time?"      No 10. OTHER SYMPTOMS: "Do you have any other symptoms?" (e.g., fever, chest pain, vomiting, diarrhea, bleeding)       None  Protocols used: Dizziness - Lightheadedness-A-AH

## 2021-04-08 ENCOUNTER — Ambulatory Visit (INDEPENDENT_AMBULATORY_CARE_PROVIDER_SITE_OTHER): Payer: Self-pay | Admitting: *Deleted

## 2021-04-08 NOTE — Telephone Encounter (Signed)
°  Chief Complaint: Boil on tailbone Symptoms: 10/10 pain, quarter size, red, warm "Feel like it needs drained." Frequency: Noted last week Pertinent Negatives: Patient denies fever Disposition: [] ED /[x] Urgent Care (no appt availability in office) / [] Appointment(In office/virtual)/ []  [place] [facility]/ [] Home Care/ [] Refused Recommended Disposition /[] [place] [facility]/ []  Follow-up with PCP Additional Notes:      Reason for Disposition  SEVERE pain (e.g., excruciating)  Answer Assessment - Initial Assessment Questions 1. APPEARANCE of BOIL: "What does the boil look like?"      Red 2. LOCATION: "Where is the boil located?"      Tailbone 3. NUMBER: "How many boils are there?"      1 4. SIZE: "How big is the boil?" (e.g., inches, cm; compare to size of a coin or other object)     Quarter size 5. ONSET: "When did the boil start?"     1 week ago 6. PAIN: "Is there any pain?" If Yes, ask: "How bad is the pain?"   (Scale 1-10; or mild, moderate, severe)     10/10 7. FEVER: "Do you have a fever?" If Yes, ask: "What is it, how was it measured, and when did it start?"      no 8. SOURCE: "Have you been around anyone with boils or other Staph infections?" "Have you ever had boils before?"     No 9. OTHER SYMPTOMS: "Do you have any other symptoms?" (e.g., shaking chills, weakness, rash elsewhere on body)     no  Protocols used: Boil (Skin Abscess)-A-AH

## 2021-04-15 ENCOUNTER — Ambulatory Visit (INDEPENDENT_AMBULATORY_CARE_PROVIDER_SITE_OTHER): Payer: Medicare Other | Admitting: Primary Care

## 2021-04-25 ENCOUNTER — Other Ambulatory Visit: Payer: Self-pay

## 2021-04-25 ENCOUNTER — Encounter (INDEPENDENT_AMBULATORY_CARE_PROVIDER_SITE_OTHER): Payer: Self-pay | Admitting: Primary Care

## 2021-04-25 ENCOUNTER — Ambulatory Visit (INDEPENDENT_AMBULATORY_CARE_PROVIDER_SITE_OTHER): Payer: Medicare Other | Admitting: Primary Care

## 2021-04-25 VITALS — BP 107/66 | HR 61 | Temp 97.8°F | Ht 61.0 in | Wt 162.2 lb

## 2021-04-25 NOTE — Progress Notes (Signed)
° ° °         [facility]   Niger Guagliardo, is a 36 y.o. female presents for a discussion for weight loss.  Patient states they have better variety, improved meal pattern, and fewer sweetened foods & beverages.  BP Readings from Last 3 Encounters:  04/25/21 107/66  03/12/21 108/71  03/12/21 116/72    Wt Readings from Last 3 Encounters:  04/25/21 162 lb 3.2 oz (73.6 kg)  03/12/21 160 lb (72.6 kg)  10/11/20 162 lb 6.4 oz (73.7 kg)   Medications: Current Outpatient Medications on File Prior to Visit  Medication Sig Dispense Refill   Ascorbic Acid (VITAMIN C PO) Take by mouth.     loratadine (CLARITIN) 10 MG tablet Take 1 tablet (10 mg total) by mouth daily. 30 tablet 11   Multiple Vitamin (MULTIVITAMIN ADULT PO) Take by mouth.     Multiple Vitamins-Minerals (ZINC PO) Take by mouth.     tiZANidine (ZANAFLEX) 2 MG tablet Take 1 tablet (2 mg total) by mouth every 8 (eight) hours as needed for muscle spasms. 21 tablet 0   VITAMIN D PO Take by mouth.     No current facility-administered medications on file prior to visit.    ROS:   Denies any headaches, blurred vision, fatigue, shortness of breath, chest pain, abdominal pain, abnormal vaginal discharge/itching/odor/irritation, problems with periods, bowel movements, urination, or intercourse unless otherwise stated above.  Physical exam:  Vitals:   04/25/21 1529  BP: 107/66  Pulse: 61  Temp: 97.8 F (36.6 C)  SpO2: 93%   BP 107/66 (BP Location: Right Arm, Patient Position: Sitting, Cuff Size: Normal)    Pulse 61    Temp 97.8 F (36.6 C) (Oral)    Ht 5\' 1"  (1.549 m)    Wt 162 lb 3.2 oz (73.6 kg)    LMP 03/12/2021 (Approximate)    SpO2 93%    BMI 30.65 kg/m  General appearance: alert, cooperative, appears stated age, and no distress Head: Normocephalic, without obvious abnormality, atraumatic Eyes: conjunctivae/corneas clear. PERRL, EOM's intact. Fundi benign. Neck: no adenopathy, no carotid bruit, no JVD, supple,  symmetrical, trachea midline, and thyroid not enlarged, symmetric, no tenderness/mass/nodules Back: symmetric, no curvature. ROM normal. No CVA tenderness. Lungs: clear to auscultation bilaterally Heart: regular rate and rhythm, S1, S2 normal, no murmur, click, rub or gallop Abdomen: soft, non-tender; bowel sounds normal; no masses,  no organomegaly Extremities: extremities normal, atraumatic, no cyanosis or edema Skin: Skin color, texture, turgor normal. No rashes or lesions Neurologic: Alert and oriented X 3, normal strength and tone. Normal symmetric reflexes. Normal coordination and gait  Assessment and Plan:  Class 2 severe obesity due to excess calories with serious comorbidity in adult, unspecified BMI ([place]) General weight loss/lifestyle modification strategies discussed (elicit support from others; identify saboteurs; non-food rewards, etc). Diet interventions: diet diary for the following weight management .   This note has been created with Surveyor, quantity. Any transcriptional errors are unintentional.  Diona Fanti, NP 04/25/2021, 3:42 PM

## 2021-05-24 ENCOUNTER — Encounter (INDEPENDENT_AMBULATORY_CARE_PROVIDER_SITE_OTHER): Payer: Self-pay

## 2021-05-24 ENCOUNTER — Other Ambulatory Visit: Payer: Self-pay

## 2021-05-24 ENCOUNTER — Ambulatory Visit (INDEPENDENT_AMBULATORY_CARE_PROVIDER_SITE_OTHER): Payer: Medicare Other

## 2021-05-24 DIAGNOSIS — Z0000 Encounter for general adult medical examination without abnormal findings: Secondary | ICD-10-CM | POA: Diagnosis not present

## 2021-05-24 NOTE — Progress Notes (Signed)
? ?Subjective:  ? Monica Davidson is a 36 y.o. female who presents for an Initial Medicare Annual Wellness Visit. ? ?   ?Objective:  ?  ?Today's Vitals  ? 05/24/21 1127  ?PainSc: 0-No pain  ? ?There is no height or weight on file to calculate BMI. ? ?Advanced Directives 03/12/2021  ?Does Patient Have a Medical Advance Directive? No  ? ? ?Current Medications (verified) ?Outpatient Encounter Medications as of 05/24/2021  ?Medication Sig  ? Ascorbic Acid (VITAMIN C PO) Take by mouth.  ? loratadine (CLARITIN) 10 MG tablet Take 1 tablet (10 mg total) by mouth daily.  ? Multiple Vitamin (MULTIVITAMIN ADULT PO) Take by mouth.  ? Multiple Vitamins-Minerals (ZINC PO) Take by mouth.  ? tiZANidine (ZANAFLEX) 2 MG tablet Take 1 tablet (2 mg total) by mouth every 8 (eight) hours as needed for muscle spasms.  ? VITAMIN D PO Take by mouth.  ? ?No facility-administered encounter medications on file as of 05/24/2021.  ? ? ?Allergies (verified) ?Patient has no known allergies.  ? ?History: ?Past Medical History:  ?Diagnosis Date  ? Anxiety   ? GERD (gastroesophageal reflux disease)   ? ?Past Surgical History:  ?Procedure Laterality Date  ? FINGER SURGERY    ? ?No family history on file. ?Social History  ? ?Socioeconomic History  ? Marital status: Single  ?  Spouse name: Not on file  ? Number of children: Not on file  ? Years of education: Not on file  ? Highest education level: Not on file  ?Occupational History  ? Not on file  ?Tobacco Use  ? Smoking status: Former  ?  Types: Cigarettes  ?  Quit date: 09/27/2015  ?  Years since quitting: 5.6  ? Smokeless tobacco: Never  ?Substance and Sexual Activity  ? Alcohol use: Not Currently  ? Drug use: No  ? Sexual activity: Not Currently  ?  Birth control/protection: None, Condom  ?Other Topics Concern  ? Not on file  ?Social History Narrative  ? Not on file  ? ?Social Determinants of Health  ? ?Financial Resource Strain: Low Risk   ? Difficulty of Paying Living Expenses: Not hard at all  ?Food  Insecurity: No Food Insecurity  ? Worried About Charity fundraiser in the Last Year: Never true  ? Ran Out of Food in the Last Year: Never true  ?Transportation Needs: No Transportation Needs  ? Lack of Transportation (Medical): No  ? Lack of Transportation (Non-Medical): No  ?Physical Activity: Insufficiently Active  ? Days of Exercise per Week: 2 days  ? Minutes of Exercise per Session: 20 min  ?Stress: Stress Concern Present  ? Feeling of Stress : To some extent  ?Social Connections: Socially Isolated  ? Frequency of Communication with Friends and Family: Once a week  ? Frequency of Social Gatherings with Friends and Family: Once a week  ? Attends Religious Services: 1 to 4 times per year  ? Active Member of Clubs or Organizations: No  ? Attends Archivist Meetings: Never  ? Marital Status: Never married  ? ? ?Tobacco Counseling ?Counseling given: Not Answered ? ? ?Clinical Intake: ? ?Pre-visit preparation completed: Yes ? ?Pain : No/denies pain ?Pain Score: 0-No pain ? ?  ? ?Nutritional Risks: None ?Diabetes: No ? ?How often do you need to have someone help you when you read instructions, pamphlets, or other written materials from your doctor or pharmacy?: 3 - Sometimes ?What is the last grade level you completed in  school?: 12th grade ? ?Diabetic?no ? ?Interpreter Needed?: No ? ?  ? ? ?Activities of Daily Living ?In your present state of health, do you have any difficulty performing the following activities: 05/24/2021  ?Hearing? N  ?Vision? N  ?Difficulty concentrating or making decisions? N  ?Walking or climbing stairs? N  ?Dressing or bathing? N  ?Doing errands, shopping? N  ?Some recent data might be hidden  ? ? ?Patient Care Team: ?Kerin Perna, NP as PCP - General (Internal Medicine) ? ?Indicate any recent Medical Services you may have received from other than [facility] providers in the past year (date may be approximate). ? ?   ?Assessment:  ? This is a routine wellness examination for  Monica. ? ?Hearing/Vision screen ?No results found. ? ?Dietary issues and exercise activities discussed: ?  ? ? Goals Addressed   ?None ?  ?Depression Screen ?PHQ 2/9 Scores 05/24/2021 04/25/2021 10/11/2020 07/06/2020 05/04/2020 03/22/2020 09/30/2019  ?PHQ - 2 Score 0 0 0 0 3 0 3  ?PHQ- 9 Score - - - - 4 - 4  ?  ?Fall Risk ?Fall Risk  05/24/2021 04/25/2021 10/11/2020 07/06/2020 05/04/2020  ?Falls in the past year? 0 0 0 0 0  ?Number falls in past yr: 0 - - 0 -  ?Injury with Fall? 0 - - 0 -  ?Risk for fall due to : Mental status change - - - -  ?Follow up Falls evaluation completed - - - -  ? ? ?FALL RISK PREVENTION PERTAINING TO THE HOME: ? ?Any stairs in or around the home? Yes  ?If so, are there any without handrails? No  ?Home free of loose throw rugs in walkways, pet beds, electrical cords, etc? Yes  ?Adequate lighting in your home to reduce risk of falls? Yes  ? ?ASSISTIVE DEVICES UTILIZED TO PREVENT FALLS: ? ?Life alert? No  ?Use of a cane, walker or w/c? No  ?Grab bars in the bathroom? No  ?Shower chair or bench in shower? No  ?Elevated toilet seat or a handicapped toilet? No  ? ? ?Cognitive Function: ?  ?  ?6CIT Screen 05/24/2021  ?What Year? 0 points  ?What month? 0 points  ?What time? 0 points  ?Count back from 20 0 points  ?Months in reverse 0 points  ?Repeat phrase 0 points  ?Total Score 0  ? ? ?Immunizations ?Immunization History  ?Administered Date(s) Administered  ? HPV 9-valent 12/27/2018  ? Tdap 09/27/2018  ? ? ?TDAP status: Up to date ? ?Flu Vaccine status: Declined, Education has been provided regarding the importance of this vaccine but patient still declined. Advised may receive this vaccine at local pharmacy or Health Dept. Aware to provide a copy of the vaccination record if obtained from local pharmacy or Health Dept. Verbalized acceptance and understanding. ? ?Pneumococcal vaccine status: Due, Education has been provided regarding the importance of this vaccine. Advised may receive this vaccine at local  pharmacy or Health Dept. Aware to provide a copy of the vaccination record if obtained from local pharmacy or Health Dept. Verbalized acceptance and understanding. ? ?Covid-19 vaccine status: Declined, Education has been provided regarding the importance of this vaccine but patient still declined. Advised may receive this vaccine at local pharmacy or Health Dept.or vaccine clinic. Aware to provide a copy of the vaccination record if obtained from local pharmacy or Health Dept. Verbalized acceptance and understanding. ? ?Qualifies for Shingles Vaccine?  N/a   ?Zostavax completed No   ?Shingrix Completed?: No.  Education has been provided regarding the importance of this vaccine. Patient has been advised to call insurance company to determine out of pocket expense if they have not yet received this vaccine. Advised may also receive vaccine at local pharmacy or Health Dept. Verbalized acceptance and understanding. ? ?Screening Tests ?Health Maintenance  ?Topic Date Due  ? COVID-19 Vaccine (1) Never done  ? INFLUENZA VACCINE  06/07/2021 (Originally 10/08/2020)  ? PAP SMEAR-Modifier  09/30/2022  ? TETANUS/TDAP  09/26/2028  ? Hepatitis C Screening  Completed  ? HIV Screening  Completed  ? HPV VACCINES  Aged Out  ? ? ?Health Maintenance ? ?Health Maintenance Due  ?Topic Date Due  ? COVID-19 Vaccine (1) Never done  ? ?Lung Cancer Screening: (Low Dose CT Chest recommended if Age 33-80 years, 30 pack-year currently smoking OR have quit w/in 15years.) does not qualify.  ? ?Lung Cancer Screening Referral: n/a ? ?Additional Screening: ? ?Hepatitis C Screening: does qualify; Completed 12/20/2020 ? ?Vision Screening: Recommended annual ophthalmology exams for early detection of glaucoma and other disorders of the eye. ?Is the patient up to date with their annual eye exam?  Yes  ?Who is the provider or what is the name of the office in which the patient attends annual eye exams? [facility] ?If pt is not established with a provider,  would they like to be referred to a provider to establish care? Yes .  ? ?Dental Screening: Recommended annual dental exams for proper oral hygiene ? ?Community Resource Referral / Chronic Care Management: ?CRR r

## 2021-05-24 NOTE — Patient Instructions (Signed)

## 2021-10-11 ENCOUNTER — Ambulatory Visit (INDEPENDENT_AMBULATORY_CARE_PROVIDER_SITE_OTHER): Payer: Medicare Other | Admitting: Primary Care

## 2021-10-11 ENCOUNTER — Other Ambulatory Visit (HOSPITAL_COMMUNITY)
Admission: RE | Admit: 2021-10-11 | Discharge: 2021-10-11 | Disposition: A | Discharge status: Home / Self Care | Payer: Medicare Other | Source: Ambulatory Visit | Attending: Primary Care | Admitting: Primary Care

## 2021-10-11 ENCOUNTER — Encounter (INDEPENDENT_AMBULATORY_CARE_PROVIDER_SITE_OTHER): Payer: Self-pay | Admitting: Primary Care

## 2021-10-11 VITALS — BP 105/68 | Temp 98.0°F | Resp 16 | Ht 61.0 in | Wt 151.2 lb

## 2021-10-11 DIAGNOSIS — N921 Excessive and frequent menstruation with irregular cycle: Secondary | ICD-10-CM | POA: Diagnosis not present

## 2021-10-11 DIAGNOSIS — R7989 Other specified abnormal findings of blood chemistry: Secondary | ICD-10-CM | POA: Diagnosis not present

## 2021-10-11 DIAGNOSIS — N898 Other specified noninflammatory disorders of vagina: Secondary | ICD-10-CM | POA: Insufficient documentation

## 2021-10-11 DIAGNOSIS — Z113 Encounter for screening for infections with a predominantly sexual mode of transmission: Secondary | ICD-10-CM

## 2021-10-11 DIAGNOSIS — Z0000 Encounter for general adult medical examination without abnormal findings: Secondary | ICD-10-CM | POA: Diagnosis not present

## 2021-10-11 NOTE — Patient Instructions (Signed)

## 2021-10-11 NOTE — Progress Notes (Signed)
[facility]  Monica Davidson is a 36 y.o. female presents to office today for annual physical exam examination.    Concerns today include: 1. Vaginal discharge   Occupation: PCA, Marital status: S, Substance use: No Diet: regular , Exercise: yes  Health Maintenance  Topic Date Due   COVID-19 Vaccine (1) Never done   HPV VACCINES (2 - 3-dose SCDM series) 01/24/2019   INFLUENZA VACCINE  10/08/2021   PAP SMEAR-Modifier  09/30/2022   TETANUS/TDAP  09/26/2028   Hepatitis C Screening  Completed   HIV Screening  Completed     Past Medical History:  Diagnosis Date   Anxiety    GERD (gastroesophageal reflux disease)    Social History   Socioeconomic History   Marital status: Single    Spouse name: Not on file   Number of children: Not on file   Years of education: Not on file   Highest education level: Not on file  Occupational History   Not on file  Tobacco Use   Smoking status: Former    Types: Cigarettes    Quit date: 09/27/2015    Years since quitting: 6.0   Smokeless tobacco: Never  Substance and Sexual Activity   Alcohol use: Not Currently   Drug use: No   Sexual activity: Not Currently    Birth control/protection: None, Condom  Other Topics Concern   Not on file  Social History Narrative   Not on file   Social Determinants of Health   Financial Resource Strain: Low Risk  (05/24/2021)   Overall Financial Resource Strain (CARD[place])    Difficulty of Paying Living Expenses: Not hard at all  Food Insecurity: No Food Insecurity (05/24/2021)   Hunger Vital Sign    Worried About Running Out of Food in the Last Year: Never true    Ran Out of Food in the Last Year: Never true  Transportation Needs: No Transportation Needs (05/24/2021)   PRAPARE - Hydrologist (Medical): No    Lack of Transportation (Non-Medical): No  Physical Activity: Insufficiently Active (05/24/2021)   Exercise Vital Sign    Days of Exercise per Week: 2  days    Minutes of Exercise per Session: 20 min  Stress: Stress Concern Present (05/24/2021)   [place]    Feeling of Stress : To some extent  Social Connections: Socially Isolated (05/24/2021)   Social Connection and Isolation Panel [NHANES]    Frequency of Communication with Friends and Family: Once a week    Frequency of Social Gatherings with Friends and Family: Once a week    Attends Religious Services: 1 to 4 times per year    Active Member of [employer] or Organizations: No    Attends Archivist Meetings: Never    Marital Status: Never married  Intimate Partner Violence: Not At Risk (05/24/2021)   Humiliation, Afraid, Rape, and Kick questionnaire    Fear of Current or Ex-Partner: No    Emotionally Abused: No    Physically Abused: No    Sexually Abused: No   Past Surgical History:  Procedure Laterality Date   FINGER SURGERY     No family history on file.  Current Outpatient Medications:    Ascorbic Acid (VITAMIN C PO), Take by mouth., Disp: , Rfl:    loratadine (CLARITIN) 10 MG tablet, Take 1 tablet (10 mg total) by mouth daily., Disp: 30 tablet, Rfl: 11   Multiple Vitamin (MULTIVITAMIN  ADULT PO), Take by mouth., Disp: , Rfl:    Multiple Vitamins-Minerals (ZINC PO), Take by mouth., Disp: , Rfl:    tiZANidine (ZANAFLEX) 2 MG tablet, Take 1 tablet (2 mg total) by mouth every 8 (eight) hours as needed for muscle spasms., Disp: 21 tablet, Rfl: 0   VITAMIN D PO, Take by mouth., Disp: , Rfl:  Outpatient Encounter Medications as of 10/11/2021  Medication Sig   Ascorbic Acid (VITAMIN C PO) Take by mouth.   loratadine (CLARITIN) 10 MG tablet Take 1 tablet (10 mg total) by mouth daily.   Multiple Vitamin (MULTIVITAMIN ADULT PO) Take by mouth.   Multiple Vitamins-Minerals (ZINC PO) Take by mouth.   tiZANidine (ZANAFLEX) 2 MG tablet Take 1 tablet (2 mg total) by mouth every 8 (eight) hours as needed for muscle  spasms.   VITAMIN D PO Take by mouth.   No facility-administered encounter medications on file as of 10/11/2021.    No Known Allergies   ROS: Review of Systems Pertinent items noted in HPI and remainder of comprehensive ROS otherwise negative.    Physical exam Physical exam: General: Vital signs reviewed.  Patient is well-developed and well-nourished, over weight  in no acute distress and cooperative with exam. Head: Normocephalic and atraumatic. Eyes: EOMI, conjunctivae normal, no scleral icterus. Neck: Supple, trachea midline, normal ROM, no JVD, masses, thyromegaly, or carotid bruit present. Cardiovascular: RRR, S1 normal, S2 normal, no murmurs, gallops, or rubs. Pulmonary/Chest: Clear to auscultation bilaterally, no wheezes, rales, or rhonchi. Abdominal: Soft, non-tender, non-distended, BS +, no masses, organomegaly, or guarding present. Musculoskeletal: No joint deformities, erythema, or stiffness, ROM full and nontender. Extremities: No lower extremity edema bilaterally,  pulses symmetric and intact bilaterally. No cyanosis or clubbing. Neurological: A&O x3, Strength is normal Skin: Warm, dry and intact. No rashes or erythema. Psychiatric: Normal mood and affect. speech and behavior is normal. Cognition and memory are normal.      Assessment/ Plan: Monica Davidson here for annual physical exam.  Monica was seen today for annual exam.  Diagnoses and all orders for this visit:  Menorrhagia with irregular cycle -     CBC with Differential  Physical exam, annual Completed  Vaginal discharge -     Cervicovaginal ancillary only  Other specified abnormal findings of blood chemistry -     CMP14+EGFR  Screening for STD (sexually transmitted disease) -     HIV antibody (with reflex)       No problem-specific Assessment & Plan notes found for this encounter.   Counseled on healthy lifestyle choices, including diet (rich in fruits, vegetables and lean meats and low in salt  and simple carbohydrates) and exercise (at least 30 minutes of moderate physical activity daily).  Patient to follow up in 1 year for annual exam or sooner if needed.  The above assessment and management plan was discussed with the patient. The patient verbalized understanding of and has agreed to the management plan. Patient is aware to call the clinic if symptoms persist or worsen. Patient is aware when to return to the clinic for a follow-up visit. Patient educated on when it is appropriate to go to the emergency department.   This note has been created with Surveyor, quantity. Any transcriptional errors are unintentional.   Kerin Perna, NP 10/11/2021, 10:54 AM

## 2021-10-12 LAB — CMP14+EGFR
ALT: 14 IU/L (ref 0–32)
AST: 17 IU/L (ref 0–40)
Albumin/Globulin Ratio: 1.8 (ref 1.2–2.2)
Albumin: 4.6 g/dL (ref 3.9–4.9)
Alkaline Phosphatase: 42 IU/L — ABNORMAL LOW (ref 44–121)
BUN/Creatinine Ratio: 12 (ref 9–23)
BUN: 11 mg/dL (ref 6–20)
Bilirubin Total: 0.9 mg/dL (ref 0.0–1.2)
CO2: 24 mmol/L (ref 20–29)
Calcium: 11.2 mg/dL — ABNORMAL HIGH (ref 8.7–10.2)
Chloride: 107 mmol/L — ABNORMAL HIGH (ref 96–106)
Creatinine, Ser: 0.93 mg/dL (ref 0.57–1.00)
Globulin, Total: 2.5 g/dL (ref 1.5–4.5)
Glucose: 85 mg/dL (ref 70–99)
Potassium: 5.2 mmol/L (ref 3.5–5.2)
Sodium: 142 mmol/L (ref 134–144)
Total Protein: 7.1 g/dL (ref 6.0–8.5)
eGFR: 82 mL/min/{1.73_m2} (ref >59)

## 2021-10-12 LAB — CBC WITH DIFFERENTIAL/PLATELET
Basophils Absolute: 0 10*3/uL (ref 0.0–0.2)
Basos: 1 %
EOS (ABSOLUTE): 0.1 10*3/uL (ref 0.0–0.4)
Eos: 1 %
Hematocrit: 39.9 % (ref 34.0–46.6)
Hemoglobin: 13.3 g/dL (ref 11.1–15.9)
Immature Grans (Abs): 0 10*3/uL (ref 0.0–0.1)
Immature Granulocytes: 0 %
Lymphocytes Absolute: 2.5 10*3/uL (ref 0.7–3.1)
Lymphs: 36 %
MCH: 31.3 pg (ref 26.6–33.0)
MCHC: 33.3 g/dL (ref 31.5–35.7)
MCV: 94 fL (ref 79–97)
Monocytes Absolute: 0.4 10*3/uL (ref 0.1–0.9)
Monocytes: 6 %
Neutrophils Absolute: 3.9 10*3/uL (ref 1.4–7.0)
Neutrophils: 56 %
Platelets: 222 10*3/uL (ref 150–450)
RBC: 4.25 x10E6/uL (ref 3.77–5.28)
RDW: 13.1 % (ref 11.7–15.4)
WBC: 6.8 10*3/uL (ref 3.4–10.8)

## 2021-10-12 LAB — HIV ANTIBODY (ROUTINE TESTING W REFLEX): HIV Screen 4th Generation wRfx: NONREACTIVE

## 2021-10-14 ENCOUNTER — Other Ambulatory Visit (INDEPENDENT_AMBULATORY_CARE_PROVIDER_SITE_OTHER): Payer: Self-pay | Admitting: Primary Care

## 2021-10-14 DIAGNOSIS — B3731 Acute candidiasis of vulva and vagina: Secondary | ICD-10-CM

## 2021-10-14 DIAGNOSIS — B9689 Other specified bacterial agents as the cause of diseases classified elsewhere: Secondary | ICD-10-CM

## 2021-10-14 LAB — CERVICOVAGINAL ANCILLARY ONLY
Bacterial Vaginitis (gardnerella): POSITIVE — AB
Candida Glabrata: NEGATIVE
Candida Vaginitis: POSITIVE — AB
Chlamydia: NEGATIVE
Comment: NEGATIVE
Comment: NEGATIVE
Comment: NEGATIVE
Comment: NEGATIVE
Comment: NEGATIVE
Comment: NORMAL
Neisseria Gonorrhea: NEGATIVE
Trichomonas: NEGATIVE

## 2021-10-14 MED ORDER — FLUCONAZOLE 150 MG PO TABS
150.0000 mg | ORAL_TABLET | Freq: Every day | ORAL | 1 refills | Status: DC
Start: 2021-10-14 — End: 2022-05-06

## 2021-10-14 MED ORDER — METRONIDAZOLE 500 MG PO TABS
500.0000 mg | ORAL_TABLET | Freq: Two times a day (BID) | ORAL | 0 refills | Status: DC
Start: 2021-10-14 — End: 2022-05-06

## 2022-04-11 ENCOUNTER — Encounter (INDEPENDENT_AMBULATORY_CARE_PROVIDER_SITE_OTHER): Payer: 59 | Admitting: Ophthalmology

## 2022-04-14 ENCOUNTER — Encounter (INDEPENDENT_AMBULATORY_CARE_PROVIDER_SITE_OTHER): Payer: 59 | Admitting: Ophthalmology

## 2022-04-14 DIAGNOSIS — H43813 Vitreous degeneration, bilateral: Secondary | ICD-10-CM | POA: Diagnosis not present

## 2022-04-14 DIAGNOSIS — D3131 Benign neoplasm of right choroid: Secondary | ICD-10-CM | POA: Diagnosis not present

## 2022-04-14 DIAGNOSIS — H35413 Lattice degeneration of retina, bilateral: Secondary | ICD-10-CM

## 2022-04-14 DIAGNOSIS — H33303 Unspecified retinal break, bilateral: Secondary | ICD-10-CM

## 2022-04-30 ENCOUNTER — Encounter (INDEPENDENT_AMBULATORY_CARE_PROVIDER_SITE_OTHER): Payer: 59 | Admitting: Ophthalmology

## 2022-04-30 DIAGNOSIS — D3131 Benign neoplasm of right choroid: Secondary | ICD-10-CM

## 2022-04-30 DIAGNOSIS — H33303 Unspecified retinal break, bilateral: Secondary | ICD-10-CM

## 2022-04-30 DIAGNOSIS — H43813 Vitreous degeneration, bilateral: Secondary | ICD-10-CM | POA: Diagnosis not present

## 2022-05-02 ENCOUNTER — Encounter (INDEPENDENT_AMBULATORY_CARE_PROVIDER_SITE_OTHER): Payer: Self-pay

## 2022-05-02 ENCOUNTER — Encounter (INDEPENDENT_AMBULATORY_CARE_PROVIDER_SITE_OTHER): Payer: 59 | Admitting: Ophthalmology

## 2022-05-06 ENCOUNTER — Ambulatory Visit (HOSPITAL_COMMUNITY)
Admission: EM | Admit: 2022-05-06 | Discharge: 2022-05-06 | Disposition: A | Discharge status: Home / Self Care | Payer: 59 | Attending: Emergency Medicine | Admitting: Emergency Medicine

## 2022-05-06 ENCOUNTER — Encounter (HOSPITAL_COMMUNITY): Payer: Self-pay

## 2022-05-06 DIAGNOSIS — N898 Other specified noninflammatory disorders of vagina: Secondary | ICD-10-CM

## 2022-05-06 DIAGNOSIS — N760 Acute vaginitis: Secondary | ICD-10-CM | POA: Insufficient documentation

## 2022-05-06 DIAGNOSIS — B9689 Other specified bacterial agents as the cause of diseases classified elsewhere: Secondary | ICD-10-CM | POA: Insufficient documentation

## 2022-05-06 LAB — POC URINE PREG, ED: Preg Test, Ur: NEGATIVE

## 2022-05-06 MED ORDER — METRONIDAZOLE 500 MG PO TABS: 500.0000 mg | ORAL_TABLET | Freq: Two times a day (BID) | ORAL | 0 refills | Status: AC

## 2022-05-06 NOTE — ED Provider Notes (Signed)
[place]    CSN: [id number] Arrival date & time: 05/06/22  1214      History   Chief Complaint Chief Complaint  Patient presents with   Vaginal Discharge    HPI Monica Davidson is a 37 y.o. female.  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 Authorizing Provider  metroNIDAZOLE (FLAGYL) 500 MG tablet Take 1 tablet (500 mg total) by mouth 2 (two) times daily for 7 days. 05/06/22 05/13/22 Yes Bilbo Carcamo, Wells Guiles, PA-C  Ascorbic Acid (VITAMIN C PO) Take by mouth.    [provider]  Multiple Vitamin (MULTIVITAMIN ADULT PO) Take by mouth.    [provider]  Multiple Vitamins-Minerals (ZINC PO) Take by mouth.    [provider]  VITAMIN D PO Take by mouth.    [provider]    Family History Family History  Problem Relation Age of Onset   Heart attack Mother     Social History Social History   Tobacco Use   Smoking status: Former    Types: Cigarettes    Quit date: 09/27/2015    Years since quitting: 6.6   Smokeless tobacco: Never  Vaping Use   Vaping Use: Never used  Substance Use Topics   Alcohol use: Not Currently   Drug use: No     Allergies   Patient has no known allergies.   Review of  Systems Review of Systems As per HPI  Physical Exam Triage Vital Signs ED Triage Vitals  Enc Vitals Group     BP      Pulse      Resp      Temp      Temp src      SpO2      Weight      Height      Head Circumference      Peak Flow      Pain Score      Pain Loc      Pain Edu?      Excl. in [place]?    No data found.  Updated Vital Signs BP 104/67 (BP Location: Left Arm)   Pulse (!) 55   Temp 98.1 F (36.7 C) (Oral)   Resp 14   LMP 04/07/2022 (Approximate)   SpO2 98%    Physical Exam Vitals and nursing note reviewed.  Constitutional:      General: She is  not in acute distress.    Appearance: Normal appearance.  HENT:     Mouth/Throat:     Pharynx: Oropharynx is clear.  Cardiovascular:     Rate and Rhythm: Normal rate and regular rhythm.     Pulses: Normal pulses.  Pulmonary:     Effort: Pulmonary effort is normal.  Neurological:     Mental Status: She is alert and oriented to person, place, and time.     UC Treatments / Results  Labs (all labs ordered are listed, but only abnormal results are displayed) Labs Reviewed  POC URINE PREG, ED  CERVICOVAGINAL ANCILLARY ONLY    EKG  Radiology No results found.  Procedures Procedures (including critical care time)  Medications Ordered in UC Medications - No data to display  Initial Impression / Assessment and Plan / UC Course  I have reviewed the triage vital signs and the nursing notes.  Pertinent labs & imaging results that were available during my care of the patient were reviewed by me and considered in my medical decision making (see chart for details).  UPT negative Cytology swab pending Will treat for BV with flagyl BID x 7 days Return precautions discussed. Patient agrees to plan  Final Clinical Impressions(s) / UC Diagnoses   Final diagnoses:  Vaginal discharge  Bacterial vaginosis     Discharge Instructions      We will call you if anything on your swab returns positive. Please  abstain from sexual intercourse until your results return.  I am treating you for BV. Take the medicine as prescribed. Do not use alcohol while taking this medicine.      ED Prescriptions     Medication Sig Dispense Auth. Provider   metroNIDAZOLE (FLAGYL) 500 MG tablet Take 1 tablet (500 mg total) by mouth 2 (two) times daily for 7 days. 14 tablet Maisen Klingler, Wells Guiles, PA-C      PDMP not reviewed this encounter.   Lige Lakeman, Wells Guiles, [place] 05/06/22 1402

## 2022-05-06 NOTE — ED Triage Notes (Signed)
Patient reports a a vaginal discharge with a foul odor x 2-3 days. Patient states she did have vaginal itching, but not now.

## 2022-05-06 NOTE — Discharge Instructions (Addendum)
We will call you if anything on your swab returns positive. Please abstain from sexual intercourse until your results return.  I am treating you for BV. Take the medicine as prescribed. Do not use alcohol while taking this medicine.

## 2022-05-07 LAB — CERVICOVAGINAL ANCILLARY ONLY
Bacterial Vaginitis (gardnerella): POSITIVE — AB
Candida Glabrata: NEGATIVE
Candida Vaginitis: NEGATIVE
Chlamydia: NEGATIVE
Comment: NEGATIVE
Comment: NEGATIVE
Comment: NEGATIVE
Comment: NEGATIVE
Comment: NEGATIVE
Comment: NORMAL
Neisseria Gonorrhea: NEGATIVE
Trichomonas: NEGATIVE

## 2022-05-09 ENCOUNTER — Telehealth: Payer: Self-pay

## 2022-05-15 ENCOUNTER — Encounter (INDEPENDENT_AMBULATORY_CARE_PROVIDER_SITE_OTHER): Payer: 59 | Admitting: Ophthalmology

## 2022-05-15 DIAGNOSIS — H33303 Unspecified retinal break, bilateral: Secondary | ICD-10-CM

## 2022-05-16 ENCOUNTER — Encounter (INDEPENDENT_AMBULATORY_CARE_PROVIDER_SITE_OTHER): Payer: 59 | Admitting: Ophthalmology

## 2022-05-16 ENCOUNTER — Encounter (INDEPENDENT_AMBULATORY_CARE_PROVIDER_SITE_OTHER): Payer: Self-pay

## 2022-06-18 ENCOUNTER — Ambulatory Visit (INDEPENDENT_AMBULATORY_CARE_PROVIDER_SITE_OTHER): Payer: 59 | Admitting: Primary Care

## 2022-06-18 ENCOUNTER — Other Ambulatory Visit (HOSPITAL_COMMUNITY)
Admission: RE | Admit: 2022-06-18 | Discharge: 2022-06-18 | Disposition: A | Discharge status: Home / Self Care | Payer: 59 | Source: Ambulatory Visit | Attending: Primary Care | Admitting: Primary Care

## 2022-06-18 ENCOUNTER — Encounter (INDEPENDENT_AMBULATORY_CARE_PROVIDER_SITE_OTHER): Payer: Self-pay | Admitting: Primary Care

## 2022-06-18 VITALS — BP 90/53 | HR 51 | Temp 97.9°F | Resp 16 | Ht 61.0 in | Wt 143.0 lb

## 2022-06-18 DIAGNOSIS — N898 Other specified noninflammatory disorders of vagina: Secondary | ICD-10-CM | POA: Diagnosis present

## 2022-06-18 NOTE — Progress Notes (Signed)
Concerns with yeast infection, has itching Used monistat 2 weeks ago and has since returned

## 2022-06-20 LAB — CERVICOVAGINAL ANCILLARY ONLY
Bacterial Vaginitis (gardnerella): POSITIVE — AB
Candida Glabrata: NEGATIVE
Candida Vaginitis: NEGATIVE
Chlamydia: NEGATIVE
Comment: NEGATIVE
Comment: NEGATIVE
Comment: NEGATIVE
Comment: NEGATIVE
Comment: NEGATIVE
Comment: NORMAL
Neisseria Gonorrhea: NEGATIVE
Trichomonas: NEGATIVE

## 2022-06-22 ENCOUNTER — Other Ambulatory Visit (INDEPENDENT_AMBULATORY_CARE_PROVIDER_SITE_OTHER): Payer: Self-pay | Admitting: Primary Care

## 2022-06-22 DIAGNOSIS — B9689 Other specified bacterial agents as the cause of diseases classified elsewhere: Secondary | ICD-10-CM

## 2022-06-22 MED ORDER — METRONIDAZOLE 500 MG PO TABS: 500.0000 mg | ORAL_TABLET | Freq: Two times a day (BID) | ORAL | 0 refills | Status: DC

## 2022-07-03 ENCOUNTER — Telehealth (INDEPENDENT_AMBULATORY_CARE_PROVIDER_SITE_OTHER): Payer: Self-pay | Admitting: Primary Care

## 2022-07-03 NOTE — Telephone Encounter (Signed)
Contacted [place] to schedule their annual wellness visit. Appointment made for 07/21/2022 . Thank you,  Judeth Cornfield,  AMB Clinical Support [facility] Direct Dial ??[phone]

## 2022-07-21 ENCOUNTER — Encounter (INDEPENDENT_AMBULATORY_CARE_PROVIDER_SITE_OTHER): Payer: Self-pay

## 2022-07-21 ENCOUNTER — Ambulatory Visit (INDEPENDENT_AMBULATORY_CARE_PROVIDER_SITE_OTHER): Payer: 59

## 2022-07-21 VITALS — BP 90/53 | Ht 61.0 in | Wt 143.0 lb

## 2022-07-21 DIAGNOSIS — Z0000 Encounter for general adult medical examination without abnormal findings: Secondary | ICD-10-CM | POA: Diagnosis not present

## 2022-07-21 DIAGNOSIS — Z0101 Encounter for examination of eyes and vision with abnormal findings: Secondary | ICD-10-CM

## 2022-07-21 NOTE — Patient Instructions (Signed)
Monica Davidson , Thank you for taking time to come for your Medicare Wellness Visit. I appreciate your ongoing commitment to your health goals. Please review the following plan we discussed and let me know if I can assist you in the future.   These are the goals we discussed:  Goals   None     This is a list of the screening recommended for you and due dates:  Health Maintenance  Topic Date Due   COVID-19 Vaccine (1) Never done   HPV Vaccine (2 - 3-dose SCDM series) 01/24/2019   Pap Smear  09/30/2022   Flu Shot  10/09/2022   Medicare Annual Wellness Visit  07/21/2023   DTaP/Tdap/Td vaccine (2 - Td or Tdap) 09/26/2028   Hepatitis C Screening: USPSTF Recommendation to screen - Ages 40-79 yo.  Completed   HIV Screening  Completed    Advanced directives: Advance directive discussed with you today. Even though you declined this today, please call our office should you change your mind, and we can give you the proper paperwork for you to fill out.   Conditions/risks identified: None identified  Next appointment: Follow up in one year for your annual wellness visit: Jul 27, 2022 at 9 am via telephone  Preventive Care 17-56 Years Old, Female Preventive care refers to lifestyle choices and visits with your health care provider that can promote health and wellness. Preventive care visits are also called wellness exams. What can I expect for my preventive care visit? Counseling During your preventive care visit, your health care provider may ask about your: Medical history, including: Past medical problems. Family medical history. Pregnancy history. Current health, including: Menstrual cycle. Method of birth control. Emotional well-being. Home life and relationship well-being. Sexual activity and sexual health. Lifestyle, including: Alcohol, nicotine or tobacco, and drug use. Access to firearms. Diet, exercise, and sleep habits. Work and work Astronomer. Sunscreen use. Safety issues  such as seatbelt and bike helmet use. Physical exam Your health care provider may check your: Height and weight. These may be used to calculate your BMI (body mass index). BMI is a measurement that tells if you are at a healthy weight. Waist circumference. This measures the distance around your waistline. This measurement also tells if you are at a healthy weight and may help predict your risk of certain diseases, such as type 2 diabetes and high blood pressure. Heart rate and blood pressure. Body temperature. Skin for abnormal spots. What immunizations do I need? Vaccines are usually given at various ages, according to a schedule. Your health care provider will recommend vaccines for you based on your age, medical history, and lifestyle or other factors, such as travel or where you work. What tests do I need? Screening Your health care provider may recommend screening tests for certain conditions. This may include: Pelvic exam and Pap test. Lipid and cholesterol levels. Diabetes screening. This is done by checking your blood sugar (glucose) after you have not eaten for a while (fasting). Hepatitis B test. Hepatitis C test. HIV (human immunodeficiency virus) test. STI (sexually transmitted infection) testing, if you are at risk. BRCA-related cancer screening. This may be done if you have a family history of breast, ovarian, tubal, or peritoneal cancers. Talk with your health care provider about your test results, treatment options, and if necessary, the need for more tests. Follow these instructions at home: Eating and drinking  Eat a healthy diet that includes fresh fruits and vegetables, whole grains, lean protein, and low-fat dairy  products. Take vitamin and mineral supplements as recommended by your health care provider. Do not drink alcohol if: Your health care provider tells you not to drink. You are pregnant, may be pregnant, or are planning to become pregnant. If you drink  alcohol: Limit how much you have to 0-1 drink a day. Know how much alcohol is in your drink. In the [place] one drink equals one 12 oz bottle of beer (355 mL), one 5 oz glass of wine (148 mL), or one 1 oz glass of hard liquor (44 mL). Lifestyle Brush your teeth every morning and night with fluoride toothpaste. Floss one time each day. Exercise for at least 30 minutes 5 or more days each week. Do not use any products that contain nicotine or tobacco. These products include cigarettes, chewing tobacco, and vaping devices, such as e-cigarettes. If you need help quitting, ask your health care provider. Do not use drugs. If you are sexually active, practice safe sex. Use a condom or other form of protection to prevent STIs. If you do not wish to become pregnant, use a form of birth control. If you plan to become pregnant, see your health care provider for a prepregnancy visit. Find healthy ways to manage stress, such as: Meditation, yoga, or listening to music. Journaling. Talking to a trusted person. Spending time with friends and family. Minimize exposure to UV radiation to reduce your risk of skin cancer. Safety Always wear your seat belt while driving or riding in a vehicle. Do not drive: If you have been drinking alcohol. Do not ride with someone who has been drinking. If you have been using any mind-altering substances or drugs. While texting. When you are tired or distracted. Wear a helmet and other protective equipment during sports activities. If you have firearms in your house, make sure you follow all gun safety procedures. Seek help if you have been physically or sexually abused. What's next? Go to your health care provider once a year for an annual wellness visit. Ask your health care provider how often you should have your eyes and teeth checked. Stay up to date on all vaccines. This information is not intended to replace advice given to you by your health care provider. Make  sure you discuss any questions you have with your health care provider. Document Revised: 08/22/2020 Document Reviewed: 08/22/2020 Elsevier Patient Education  2022 Elsevier Inc.   CONGRATULATIONS ON STARTING YOU EDUCATION JOURNEY!! KEEP GOING. I KNOW YOU WILL BLOW THIS OUT OF THE WATER!!!!

## 2022-07-21 NOTE — Progress Notes (Signed)
 I connected with  Monica Davidson on 07/21/22 by a audio enabled telemedicine application and verified that I am speaking with the correct person using two identifiers.  Patient Location: Home  Provider Location: Home Office  I discussed the limitations of evaluation and management by telemedicine. The patient expressed understanding and agreed to proceed.  Subjective:   Monica Davidson is a 37 y.o. female who presents for Medicare Annual (Subsequent) preventive examination.  Review of Systems     Cardiac Risk Factors include: none     Objective:    Today's Vitals   07/21/22 1023 07/21/22 1024  BP: (!) 90/53   Weight: 143 lb (64.9 kg)   Height: 5\' 1"  (1.549 m)   PainSc: 0-No pain 0-No pain   Body mass index is 27.02 kg/m.     07/21/2022   10:38 AM 03/12/2021    1:00 PM  Advanced Directives  Does Patient Have a Medical Advance Directive? No No  Would patient like information on creating a medical advance directive? No - Patient declined     Current Medications (verified) Outpatient Encounter Medications as of 07/21/2022  Medication Sig   Ascorbic Acid (VITAMIN C PO) Take by mouth. (Patient not taking: Reported on 06/18/2022)   metroNIDAZOLE (FLAGYL) 500 MG tablet Take 1 tablet (500 mg total) by mouth 2 (two) times daily. (Patient not taking: Reported on 07/21/2022)   Multiple Vitamin (MULTIVITAMIN ADULT PO) Take by mouth. (Patient not taking: Reported on 06/18/2022)   Multiple Vitamins-Minerals (ZINC PO) Take by mouth. (Patient not taking: Reported on 06/18/2022)   VITAMIN D PO Take by mouth. (Patient not taking: Reported on 06/18/2022)   No facility-administered encounter medications on file as of 07/21/2022.    Allergies (verified) Patient has no known allergies.   History: Past Medical History:  Diagnosis Date   Anxiety    GERD (gastroesophageal reflux disease)    Past Surgical History:  Procedure Laterality Date   EYE SURGERY     FINGER SURGERY     Family History   Problem Relation Age of Onset   Heart attack Mother    Social History   Socioeconomic History   Marital status: Single    Spouse name: Not on file   Number of children: Not on file   Years of education: Not on file   Highest education level: Not on file  Occupational History   Not on file  Tobacco Use   Smoking status: Former    Types: Cigarettes    Quit date: 09/27/2015    Years since quitting: 6.8   Smokeless tobacco: Never  Vaping Use   Vaping Use: Never used  Substance and Sexual Activity   Alcohol use: Not Currently   Drug use: No   Sexual activity: Yes    Birth control/protection: None, Condom  Other Topics Concern   Not on file  Social History Narrative   Patient is currently in school working on her Associates Degree so 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 Chief Operating Officer and become a Veterinary surgeon   Social Determinants of Health   Financial Resource Strain: Low Risk  (07/21/2022)   Overall Financial Resource Strain (CARDIA)    Difficulty of Paying Living Expenses: Not hard at all  Food Insecurity: No Food Insecurity (07/21/2022)   Hunger Vital Sign    Worried About Running Out of Food in the Last Year: Never true    Ran Out of Food in the Last Year: Never true  Transportation Needs: No Transportation Needs (07/21/2022)   PRAPARE -  Administrator, Civil Service (Medical): No    Lack of Transportation (Non-Medical): No  Physical Activity: Sufficiently Active (07/21/2022)   Exercise Vital Sign    Days of Exercise per Week: 5 days    Minutes of Exercise per Session: 30 min  Stress: No Stress Concern Present (07/21/2022)   [employer] of Occupational Health - Occupational Stress Questionnaire    Feeling of Stress : Not at all  Social Connections: Socially Integrated (07/21/2022)   Social Connection and Isolation Panel [NHANES]    Frequency of Communication with Friends and Family: More than three times a week    Frequency of Social Gatherings with Friends and Family: More than  three times a week    Attends Religious Services: More than 4 times per year    Active Member of [employer] or Organizations: Yes    Attends Engineer, structural: More than 4 times per year    Marital Status: Married    Tobacco Counseling Counseling given: Yes   Clinical Intake:  Pre-visit preparation completed: Yes  Pain : No/denies pain Pain Score: 0-No pain     BMI - recorded: 27.02 Nutritional Status: BMI 25 -29 Overweight Nutritional Risks: None Diabetes: No  How often do you need to have someone help you when you read instructions, pamphlets, or other written materials from your doctor or pharmacy?: 1 - Never  Diabetic?No  Interpreter Needed?: No  Information entered by ::  Dani Wallner, CMA   Activities of Daily Living    07/21/2022   10:43 AM 07/17/2022    9:48 AM  In your present state of health, do you have any difficulty performing the following activities:  Hearing? 0 0  Vision? 1 0  Comment Referral placed for Dr.Groat   Difficulty concentrating or making decisions? 0 0  Walking or climbing stairs? 0 0  Dressing or bathing? 0 0  Doing errands, shopping? 0 0  Preparing Food and eating ? N N  Using the Toilet? N N  In the past six months, have you accidently leaked urine? N N  Do you have problems with loss of bowel control? N N  Managing your Medications? N N  Managing your Finances? N N  Housekeeping or managing your Housekeeping? N N    Patient Care Team: Grayce Sessions, NP as PCP - General (Internal Medicine)  Indicate any recent Medical Services you may have received from other than [facility] providers in the past year (date may be approximate).     Assessment:   This is a routine wellness examination for Monica.  Hearing/Vision screen Hearing Screening - Comments:: Patient denies any hearing difficulties Vision Screening - Comments:: Patient wears glasses. Recently had eye surgery working towards Lasix eye surgery. Has to go to the  eye doctor in July. Referral placed today   Dietary issues and exercise activities discussed: Current Exercise Habits: Home exercise routine, Type of exercise: walking, Time (Minutes): 30, Frequency (Times/Week): 7, Weekly Exercise (Minutes/Week): 210, Intensity: Mild, Exercise limited by: None identified   Goals Addressed             This Visit's Progress    Patient Stated       Patient states her goal is to start going to the gym and continue to drink water.        Depression Screen    07/21/2022   10:29 AM 06/18/2022    4:46 PM 10/11/2021   10:40 AM 05/24/2021   11:26 AM 04/25/2021  3:30 PM 10/11/2020   11:15 AM 07/06/2020   10:49 AM  PHQ 2/9 Scores  PHQ - 2 Score 0 0 0 0 0 0 0  PHQ- 9 Score 0 0 0        Fall Risk    07/21/2022   10:38 AM 07/17/2022    9:48 AM 06/18/2022    4:46 PM 05/24/2021   11:25 AM 04/25/2021    3:28 PM  Fall Risk   Falls in the past year? 0 0 0 0 0  Number falls in past yr: 0 0 0 0   Injury with Fall? 0 0 0 0   Risk for fall due to : No Fall Risks  No Fall Risks Mental status change   Follow up Falls prevention discussed  Falls evaluation completed Falls evaluation completed     FALL RISK PREVENTION PERTAINING TO THE HOME:  Any stairs in or around the home? Yes  If so, are there any without handrails? No  Home free of loose throw rugs in walkways, pet beds, electrical cords, etc? Yes  Adequate lighting in your home to reduce risk of falls? Yes   ASSISTIVE DEVICES UTILIZED TO PREVENT FALLS:  Life alert? No  Use of a cane, walker or w/c? No  Grab bars in the bathroom? No  Shower chair or bench in shower? No  Elevated toilet seat or a handicapped toilet? No   TIMED UP AND GO:  Was the test performed? No . Telephone visit Cognitive Function:        07/21/2022   10:44 AM 05/24/2021   11:23 AM  6CIT Screen  What Year? 0 points 0 points  What month? 0 points 0 points  What time? 0 points 0 points  Count back from 20 0 points 0 points   Months in reverse 0 points 0 points  Repeat phrase 0 points 0 points  Total Score 0 points 0 points    Immunizations Immunization History  Administered Date(s) Administered   HPV 9-valent 12/27/2018   Tdap 09/27/2018    TDAP status: Up to date  Flu Vaccine status: Up to date   Covid-19 vaccine status: Information provided on how to obtain vaccines.   Qualifies for Shingles Vaccine? No   Zostavax completed  doesn't qualify due to age   Patient is too young for Shingrix Screening Tests Health Maintenance  Topic Date Due   COVID-19 Vaccine (1) Never done   HPV VACCINES (2 - 3-dose SCDM series) 01/24/2019   PAP SMEAR-Modifier  09/30/2022   INFLUENZA VACCINE  10/09/2022   Medicare Annual Wellness (AWV)  07/21/2023   DTaP/Tdap/Td (2 - Td or Tdap) 09/26/2028   Hepatitis C Screening  Completed   HIV Screening  Completed    Health Maintenance  Health Maintenance Due  Topic Date Due   COVID-19 Vaccine (1) Never done   HPV VACCINES (2 - 3-dose SCDM series) 01/24/2019    Patient does not qualify for colorectal screenings or mammograms  Lung Cancer Screening: (Low Dose CT Chest recommended if Age 71-80 years, 30 pack-year currently smoking OR have quit w/in 15years.) does not qualify.    Additional Screening:  Hepatitis C Screening: does qualify; Completed 12/20/2020  Vision Screening: Recommended annual ophthalmology exams for early detection of glaucoma and other disorders of the eye. Is the patient up to date with their annual eye exam?  No  Who is the provider or what is the name of the office in which the patient attends annual  eye exams? Referral placed today If pt is not established with a provider, would they like to be referred to a provider to establish care? Yes . Referral placed for Dr.Groat  Dental Screening: Recommended annual dental exams for proper oral hygiene  Community Resource Referral / Chronic Care Management: CRR required this visit?  No   CCM  required this visit?  No      Plan:     I have personally reviewed and noted the following in the patient's chart:   Medical and social history Use of alcohol, tobacco or illicit drugs  Current medications and supplements including opioid prescriptions. Patient is not currently taking opioid prescriptions. Functional ability and status Nutritional status Physical activity Advanced directives List of other physicians Hospitalizations, surgeries, and ER visits in previous 12 months Vitals Screenings to include cognitive, depression, and falls Referrals and appointments  In addition, I have reviewed and discussed with patient certain preventive protocols, quality metrics, and best practice recommendations. A written personalized care plan for preventive services as well as general preventive health recommendations were provided to patient.   Patient would like to access on my-chart    Jordan Hawks Altariq Goodall, CMA   07/21/2022   Nurse Notes: Referral placed for eye doctor, Dr.Groat. Patient in agreement with referral

## 2022-09-12 ENCOUNTER — Ambulatory Visit (INDEPENDENT_AMBULATORY_CARE_PROVIDER_SITE_OTHER): Payer: 59 | Admitting: Primary Care

## 2022-09-15 ENCOUNTER — Encounter (INDEPENDENT_AMBULATORY_CARE_PROVIDER_SITE_OTHER): Payer: 59 | Admitting: Ophthalmology

## 2022-09-15 ENCOUNTER — Encounter (INDEPENDENT_AMBULATORY_CARE_PROVIDER_SITE_OTHER): Payer: Self-pay

## 2022-09-16 ENCOUNTER — Ambulatory Visit (INDEPENDENT_AMBULATORY_CARE_PROVIDER_SITE_OTHER): Payer: 59 | Admitting: Primary Care

## 2022-09-23 ENCOUNTER — Ambulatory Visit (INDEPENDENT_AMBULATORY_CARE_PROVIDER_SITE_OTHER): Payer: 59 | Admitting: Primary Care

## 2022-09-23 ENCOUNTER — Other Ambulatory Visit (HOSPITAL_COMMUNITY)
Admission: RE | Admit: 2022-09-23 | Discharge: 2022-09-23 | Disposition: A | Discharge status: Home / Self Care | Payer: 59 | Source: Ambulatory Visit | Attending: Primary Care | Admitting: Primary Care

## 2022-09-23 ENCOUNTER — Encounter (INDEPENDENT_AMBULATORY_CARE_PROVIDER_SITE_OTHER): Payer: Self-pay | Admitting: Primary Care

## 2022-09-23 VITALS — BP 129/67 | HR 68 | Resp 16 | Wt 139.4 lb

## 2022-09-23 DIAGNOSIS — Z124 Encounter for screening for malignant neoplasm of cervix: Secondary | ICD-10-CM | POA: Diagnosis not present

## 2022-09-23 DIAGNOSIS — Z01419 Encounter for gynecological examination (general) (routine) without abnormal findings: Secondary | ICD-10-CM | POA: Insufficient documentation

## 2022-09-23 DIAGNOSIS — Z1151 Encounter for screening for human papillomavirus (HPV): Secondary | ICD-10-CM | POA: Insufficient documentation

## 2022-09-24 LAB — CERVICOVAGINAL ANCILLARY ONLY
Bacterial Vaginitis (gardnerella): POSITIVE — AB
Candida Glabrata: NEGATIVE
Candida Vaginitis: POSITIVE — AB
Chlamydia: NEGATIVE
Comment: NEGATIVE
Comment: NEGATIVE
Comment: NEGATIVE
Comment: NEGATIVE
Comment: NEGATIVE
Comment: NORMAL
Neisseria Gonorrhea: NEGATIVE
Trichomonas: NEGATIVE

## 2022-09-24 NOTE — Progress Notes (Signed)
[facility]   Subjective:   Monica Davidson is a 37 y.o. G0P0000 female here for a routine annual gynecologic exam.    Denies abnormal vaginal bleeding, discharge, pelvic pain, problems with intercourse or other gynecologic concerns.    Gynecologic History No LMP recorded. (Menstrual status: Irregular Periods). Contraception: none  Health Maintenance  Topic Date Due   PAP SMEAR-Modifier  09/30/2022   HPV VACCINES (2 - 3-dose SCDM series) 03/10/2023 (Originally 01/24/2019)   COVID-19 Vaccine (1 - 2023-24 season) 03/10/2023 (Originally 11/08/2021)   INFLUENZA VACCINE  10/09/2022   Medicare Annual Wellness (AWV)  07/21/2023   DTaP/Tdap/Td (2 - Td or Tdap) 09/26/2028   Hepatitis C Screening  Completed   HIV Screening  Completed    Obstetric History OB History  Gravida Para Term Preterm AB Living  0 0 0 0 0 0  SAB IAB Ectopic Multiple Live Births  0 0 0 0 0    Past Medical History:  Diagnosis Date   Anxiety    GERD (gastroesophageal reflux disease)     Past Surgical History:  Procedure Laterality Date   EYE SURGERY     FINGER SURGERY      Current Outpatient Medications on File Prior to Visit  Medication Sig Dispense Refill   Ascorbic Acid (VITAMIN C PO) Take by mouth. (Patient not taking: Reported on 06/18/2022)     metroNIDAZOLE (FLAGYL) 500 MG tablet Take 1 tablet (500 mg total) by mouth 2 (two) times daily. (Patient not taking: Reported on 07/21/2022) 14 tablet 0   Multiple Vitamin (MULTIVITAMIN ADULT PO) Take by mouth. (Patient not taking: Reported on 06/18/2022)     Multiple Vitamins-Minerals (ZINC PO) Take by mouth. (Patient not taking: Reported on 06/18/2022)     VITAMIN D PO Take by mouth. (Patient not taking: Reported on 06/18/2022)     No current facility-administered medications on file prior to visit.    No Known Allergies  Social History   Socioeconomic History   Marital status: Single    Spouse name: Not on file   Number of children: Not  on file   Years of education: Not on file   Highest education level: Some college, no degree  Occupational History   Not on file  Tobacco Use   Smoking status: Former    Current packs/day: 0.00    Types: Cigarettes    Quit date: 09/27/2015    Years since quitting: 6.9   Smokeless tobacco: Never  Vaping Use   Vaping status: Never Used  Substance and Sexual Activity   Alcohol use: Not Currently   Drug use: No   Sexual activity: Yes    Birth control/protection: None, Condom  Other Topics Concern   Not on file  Social History Narrative   Patient is currently in school working on her Associates Degree so 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 Chief Operating Officer and become a Veterinary surgeon   Social Determinants of Health   Financial Resource Strain: Low Risk  (09/19/2022)   Overall Financial Resource Strain (CARDIA)    Difficulty of Paying Living Expenses: Not hard at all  Food Insecurity: No Food Insecurity (09/19/2022)   Hunger Vital Sign    Worried About Running Out of Food in the Last Year: Never true    Ran Out of Food in the Last Year: Never true  Transportation Needs: No Transportation Needs (09/19/2022)   PRAPARE - Administrator, Civil Service (Medical): No    Lack of Transportation (Non-Medical): No  Physical  Activity: Sufficiently Active (09/19/2022)   Exercise Vital Sign    Days of Exercise per Week: 3 days    Minutes of Exercise per Session: 60 min  Stress: No Stress Concern Present (09/19/2022)   [employer] of Occupational Health - Occupational Stress Questionnaire    Feeling of Stress : Not at all  Social Connections: Moderately Integrated (09/19/2022)   Social Connection and Isolation Panel [NHANES]    Frequency of Communication with Friends and Family: More than three times a week    Frequency of Social Gatherings with Friends and Family: Once a week    Attends Religious Services: More than 4 times per year    Active Member of [employer] or Organizations: Yes    Attends Museum/gallery exhibitions officer: More than 4 times per year    Marital Status: Never married  Intimate Partner Violence: Not At Risk (07/21/2022)   Humiliation, Afraid, Rape, and Kick questionnaire    Fear of Current or Ex-Partner: No    Emotionally Abused: No    Physically Abused: No    Sexually Abused: No    Family History  Problem Relation Age of Onset   Heart attack Mother     The following portions of the patient's history were reviewed and updated as appropriate: allergies, current medications, past family history, past medical history, past social history, past surgical history and problem list.  Review of Systems Pertinent items noted in HPI and remainder of comprehensive ROS otherwise negative.   Objective:  Blood Pressure 129/67   Pulse 68   Respiration 16   Weight 139 lb 6.4 oz (63.2 kg)   Oxygen Saturation 98%   Body Mass Index 26.34 kg/m  CONSTITUTIONAL: Well-developed, well-nourished female in no acute distress.  HENT:  Normocephalic, atraumatic, External right and left ear normal. Oropharynx is clear and moist EYES: Conjunctivae and EOM are normal. Pupils are equal, round, and reactive to light. No scleral icterus.  NECK: Normal range of motion, supple, no masses.  Normal thyroid.  SKIN: Skin is warm and dry. No rash noted. Not diaphoretic. No erythema. No pallor. NEUROLGIC: Alert and oriented to person, place, and time. Normal reflexes, muscle tone coordination. No cranial nerve deficit noted. PSYCHIATRIC: Normal mood and affect. Normal behavior. Normal judgment and thought content. CARDIOVASCULAR: Normal heart rate noted, regular rhythm RESPIRATORY: Clear to auscultation bilaterally. Effort and breath sounds normal, no problems with respiration noted. BREASTS: Symmetric in size. No masses, skin changes, nipple drainage, or lymphadenopathy. Taught self breast exam and had patient to demonstrate SBE. ABDOMEN: Soft, normal bowel sounds, no distention noted.  No tenderness,  rebound or guarding.  PELVIC: Normal appearing external genitalia; normal appearing vaginal mucosa and cervix.  No abnormal discharge noted.  Pap smear obtained.  Normal uterine size, no other palpable masses, no uterine or adnexal tenderness. MUSCULOSKELETAL: Normal range of motion. No tenderness.  No cyanosis, clubbing, or edema.  2+ distal pulses.   Assessment:  Annual gynecologic examination with pap smear  Monica was seen today for gynecologic exam.  Diagnoses and all orders for this visit:  Cervical cancer screening -     Cervicovaginal ancillary only -     Cytology - PAP   Routine preventative health maintenance measures emphasized. Please refer to After Visit Summary for other counseling recommendations.   This note has been created with Education officer, environmental. Any transcriptional errors are unintentional.   Grayce Sessions, NP 09/24/2022, 12:30 PM

## 2022-09-25 LAB — CYTOLOGY - PAP
Comment: NEGATIVE
Comment: NEGATIVE
Comment: NEGATIVE
Diagnosis: UNDETERMINED — AB
HPV 16: NEGATIVE
HPV 18 / 45: NEGATIVE
High risk HPV: POSITIVE — AB

## 2022-09-27 ENCOUNTER — Other Ambulatory Visit (INDEPENDENT_AMBULATORY_CARE_PROVIDER_SITE_OTHER): Payer: Self-pay | Admitting: Primary Care

## 2022-09-27 DIAGNOSIS — N760 Acute vaginitis: Secondary | ICD-10-CM

## 2022-09-27 MED ORDER — METRONIDAZOLE 500 MG PO TABS: 500.0000 mg | ORAL_TABLET | Freq: Two times a day (BID) | ORAL | 0 refills | Status: DC

## 2022-09-27 MED ORDER — FLUCONAZOLE 150 MG PO TABS: 150.0000 mg | ORAL_TABLET | Freq: Every day | ORAL | 1 refills | Status: DC

## 2022-10-01 ENCOUNTER — Encounter (INDEPENDENT_AMBULATORY_CARE_PROVIDER_SITE_OTHER): Payer: 59 | Admitting: Ophthalmology

## 2022-10-01 DIAGNOSIS — H33303 Unspecified retinal break, bilateral: Secondary | ICD-10-CM

## 2022-10-01 DIAGNOSIS — D3131 Benign neoplasm of right choroid: Secondary | ICD-10-CM | POA: Diagnosis not present

## 2022-12-26 ENCOUNTER — Ambulatory Visit (INDEPENDENT_AMBULATORY_CARE_PROVIDER_SITE_OTHER): Payer: 59 | Admitting: Primary Care

## 2022-12-26 ENCOUNTER — Encounter (INDEPENDENT_AMBULATORY_CARE_PROVIDER_SITE_OTHER): Payer: Self-pay | Admitting: Primary Care

## 2022-12-26 VITALS — BP 112/63 | HR 67 | Resp 16 | Ht 61.0 in | Wt 144.8 lb

## 2022-12-26 DIAGNOSIS — N921 Excessive and frequent menstruation with irregular cycle: Secondary | ICD-10-CM | POA: Diagnosis not present

## 2022-12-26 DIAGNOSIS — Z0000 Encounter for general adult medical examination without abnormal findings: Secondary | ICD-10-CM

## 2022-12-26 DIAGNOSIS — E559 Vitamin D deficiency, unspecified: Secondary | ICD-10-CM

## 2022-12-26 DIAGNOSIS — Z2821 Immunization not carried out because of patient refusal: Secondary | ICD-10-CM

## 2022-12-26 NOTE — Progress Notes (Signed)
[facility]  Monica Davidson is a 37 y.o. female presents to office today for annual physical exam examination.    Concerns today include: 1. None  Occupation: PCA, Marital status: S, Substance use: N Diet: None, Exercise: yes daily walks and fitness class  Health Maintenance  Topic Date Due   COVID-19 Vaccine (1 - 2023-24 season) Never done   HPV VACCINES (2 - 3-dose SCDM series) 03/10/2023 (Originally 01/24/2019)   INFLUENZA VACCINE  06/08/2023 (Originally 10/09/2022)   Medicare Annual Wellness (AWV)  07/21/2023   Cervical Cancer Screening (HPV/Pap Cotest)  09/23/2027   DTaP/Tdap/Td (2 - Td or Tdap) 09/26/2028   Hepatitis C Screening  Completed   HIV Screening  Completed     Past Medical History:  Diagnosis Date   Anxiety    GERD (gastroesophageal reflux disease)    Social History   Socioeconomic History   Marital status: Single    Spouse name: Not on file   Number of children: Not on file   Years of education: Not on file   Highest education level: Some college, no degree  Occupational History   Not on file  Tobacco Use   Smoking status: Former    Current packs/day: 0.00    Types: Cigarettes    Quit date: 09/27/2015    Years since quitting: 7.2   Smokeless tobacco: Never  Vaping Use   Vaping status: Never Used  Substance and Sexual Activity   Alcohol use: Not Currently   Drug use: No   Sexual activity: Yes    Birth control/protection: None, Condom  Other Topics Concern   Not on file  Social History Narrative   Patient is currently in school working on her Associates Degree so 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 Chief Operating Officer and become a Veterinary surgeon   Social Determinants of Health   Financial Resource Strain: Low Risk  (09/19/2022)   Overall Financial Resource Strain (CARDIA)    Difficulty of Paying Living Expenses: Not hard at all  Food Insecurity: No Food Insecurity (09/19/2022)   Hunger Vital Sign    Worried About Running Out of Food in the Last Year: Never true     Ran Out of Food in the Last Year: Never true  Transportation Needs: No Transportation Needs (09/19/2022)   PRAPARE - Administrator, Civil Service (Medical): No    Lack of Transportation (Non-Medical): No  Physical Activity: Sufficiently Active (09/19/2022)   Exercise Vital Sign    Days of Exercise per Week: 3 days    Minutes of Exercise per Session: 60 min  Stress: No Stress Concern Present (09/19/2022)   [employer] of Occupational Health - Occupational Stress Questionnaire    Feeling of Stress : Not at all  Social Connections: Moderately Integrated (09/19/2022)   Social Connection and Isolation Panel [NHANES]    Frequency of Communication with Friends and Family: More than three times a week    Frequency of Social Gatherings with Friends and Family: Once a week    Attends Religious Services: More than 4 times per year    Active Member of [employer] or Organizations: Yes    Attends Banker Meetings: More than 4 times per year    Marital Status: Never married  Intimate Partner Violence: Not At Risk (07/21/2022)   Humiliation, Afraid, Rape, and Kick questionnaire    Fear of Current or Ex-Partner: No    Emotionally Abused: No    Physically Abused: No    Sexually Abused: No  Past Surgical History:  Procedure Laterality Date   EYE SURGERY     FINGER SURGERY     Family History  Problem Relation Age of Onset   Heart attack Mother     Current Outpatient Medications:    Ascorbic Acid (VITAMIN C PO), Take by mouth. (Patient not taking: Reported on 06/18/2022), Disp: , Rfl:    Multiple Vitamin (MULTIVITAMIN ADULT PO), Take by mouth. (Patient not taking: Reported on 06/18/2022), Disp: , Rfl:    Multiple Vitamins-Minerals (ZINC PO), Take by mouth. (Patient not taking: Reported on 06/18/2022), Disp: , Rfl:    VITAMIN D PO, Take by mouth. (Patient not taking: Reported on 06/18/2022), Disp: , Rfl:  Outpatient Encounter Medications as of 12/26/2022  Medication Sig    Ascorbic Acid (VITAMIN C PO) Take by mouth. (Patient not taking: Reported on 06/18/2022)   Multiple Vitamin (MULTIVITAMIN ADULT PO) Take by mouth. (Patient not taking: Reported on 06/18/2022)   Multiple Vitamins-Minerals (ZINC PO) Take by mouth. (Patient not taking: Reported on 06/18/2022)   VITAMIN D PO Take by mouth. (Patient not taking: Reported on 06/18/2022)   [DISCONTINUED] fluconazole (DIFLUCAN) 150 MG tablet Take 1 tablet (150 mg total) by mouth daily. (Patient not taking: Reported on 12/26/2022)   [DISCONTINUED] metroNIDAZOLE (FLAGYL) 500 MG tablet Take 1 tablet (500 mg total) by mouth 2 (two) times daily. (Patient not taking: Reported on 12/26/2022)   No facility-administered encounter medications on file as of 12/26/2022.    No Known Allergies   ROS: Review of Systems Pertinent items noted in HPI and remainder of comprehensive ROS otherwise negative.    Physical exam BP 112/63   Pulse 67   Resp 16   Ht 5\' 1"  (1.549 m)   Wt 144 lb 12.8 oz (65.7 kg)   SpO2 97%   BMI 27.36 kg/m  General appearance: alert, cooperative, appears stated age, and no distress Head: Normocephalic, without obvious abnormality, atraumatic Eyes: conjunctivae/corneas clear. PERRL, EOM's intact. Fundi benign. Ears: normal TM's and external ear canals both ears Nose: Nares normal. Septum midline. Mucosa normal. No drainage or sinus tenderness. Neck: no adenopathy, no carotid bruit, no JVD, supple, symmetrical, trachea midline, and thyroid not enlarged, symmetric, no tenderness/mass/nodules Back: symmetric, no curvature. ROM normal. No CVA tenderness. Lungs: clear to auscultation bilaterally Heart: regular rate and rhythm, S1, S2 normal, no murmur, click, rub or gallop Abdomen: soft, non-tender; bowel sounds normal; no masses,  no organomegaly Extremities: extremities normal, atraumatic, no cyanosis or edema Pulses: 2+ and symmetric Skin: Skin color, texture, turgor normal. No rashes or lesions Lymph  nodes: Cervical, supraclavicular, and axillary nodes normal. Neurologic: Alert and oriented X 3, normal strength and tone. Normal symmetric reflexes. Normal coordination and gait    Assessment/ Plan: Monica Davidson here for annual physical exam.   Monica was seen today for annual exam.  Diagnoses and all orders for this visit: Monica was seen today for annual exam.  Diagnoses and all orders for this visit:  Physical exam, annual  Influenza vaccination declined  Menorrhagia with irregular cycle -     CBC with Differential  Vitamin D deficiency -     Vitamin D, 25-hydroxy -     [id number]+EGFR    Counseled on healthy lifestyle choices, including diet (rich in fruits, vegetables and lean meats and low in salt and simple carbohydrates) and exercise (at least 30 minutes of moderate physical activity daily).  Patient to follow up in 1 year for annual exam or sooner if needed.  The above assessment and management plan was discussed with the patient. The patient verbalized understanding of and has agreed to the management plan. Patient is aware to call the clinic if symptoms persist or worsen. Patient is aware when to return to the clinic for a follow-up visit. Patient educated on when it is appropriate to go to the emergency department.   This note has been created with Education officer, environmental. Any transcriptional errors are unintentional.   Grayce Sessions, NP 12/26/2022, 10:10 AM

## 2022-12-27 LAB — CBC WITH DIFFERENTIAL/PLATELET
Basophils Absolute: 0 10*3/uL (ref 0.0–0.2)
Basos: 1 %
EOS (ABSOLUTE): 0.1 10*3/uL (ref 0.0–0.4)
Eos: 2 %
Hematocrit: 40.3 % (ref 34.0–46.6)
Hemoglobin: 13.6 g/dL (ref 11.1–15.9)
Immature Grans (Abs): 0 10*3/uL (ref 0.0–0.1)
Immature Granulocytes: 0 %
Lymphocytes Absolute: 2.6 10*3/uL (ref 0.7–3.1)
Lymphs: 47 %
MCH: 32.2 pg (ref 26.6–33.0)
MCHC: 33.7 g/dL (ref 31.5–35.7)
MCV: 96 fL (ref 79–97)
Monocytes Absolute: 0.5 10*3/uL (ref 0.1–0.9)
Monocytes: 8 %
Neutrophils Absolute: 2.3 10*3/uL (ref 1.4–7.0)
Neutrophils: 42 %
Platelets: 281 10*3/uL (ref 150–450)
RBC: 4.22 x10E6/uL (ref 3.77–5.28)
RDW: 13.6 % (ref 11.7–15.4)
WBC: 5.4 10*3/uL (ref 3.4–10.8)

## 2022-12-27 LAB — CMP14+EGFR
ALT: 11 [IU]/L (ref 0–32)
AST: 15 [IU]/L (ref 0–40)
Albumin: 4.5 g/dL (ref 3.9–4.9)
Alkaline Phosphatase: 43 [IU]/L — ABNORMAL LOW (ref 44–121)
BUN/Creatinine Ratio: 6 — ABNORMAL LOW (ref 9–23)
BUN: 7 mg/dL (ref 6–20)
Bilirubin Total: 1.4 mg/dL — ABNORMAL HIGH (ref 0.0–1.2)
CO2: 22 mmol/L (ref 20–29)
Calcium: 11.1 mg/dL — ABNORMAL HIGH (ref 8.7–10.2)
Chloride: 105 mmol/L (ref 96–106)
Creatinine, Ser: 1.11 mg/dL — ABNORMAL HIGH (ref 0.57–1.00)
Globulin, Total: 2.6 g/dL (ref 1.5–4.5)
Glucose: 93 mg/dL (ref 70–99)
Potassium: 4.6 mmol/L (ref 3.5–5.2)
Sodium: 140 mmol/L (ref 134–144)
Total Protein: 7.1 g/dL (ref 6.0–8.5)
eGFR: 66 mL/min/{1.73_m2} (ref >59)

## 2022-12-27 LAB — VITAMIN D 25 HYDROXY (VIT D DEFICIENCY, FRACTURES): Vit D, 25-Hydroxy: 12.8 ng/mL — ABNORMAL LOW (ref 30.0–100.0)

## 2022-12-29 ENCOUNTER — Encounter (INDEPENDENT_AMBULATORY_CARE_PROVIDER_SITE_OTHER): Payer: 59 | Admitting: Primary Care

## 2022-12-29 ENCOUNTER — Other Ambulatory Visit (INDEPENDENT_AMBULATORY_CARE_PROVIDER_SITE_OTHER): Payer: Self-pay | Admitting: Primary Care

## 2022-12-29 DIAGNOSIS — E559 Vitamin D deficiency, unspecified: Secondary | ICD-10-CM

## 2022-12-29 MED ORDER — ERGOCALCIFEROL 1.25 MG (50000 UT) PO CAPS: 50000.0000 [IU] | ORAL_CAPSULE | ORAL | 0 refills | Status: AC

## 2023-01-01 ENCOUNTER — Telehealth (INDEPENDENT_AMBULATORY_CARE_PROVIDER_SITE_OTHER): Payer: Self-pay

## 2023-01-01 NOTE — Telephone Encounter (Signed)
Copied from CRM [id number]. Topic: General - Other >> Jan 01, 2023  9:32 AM Turkey B wrote: Reason for CRM: Pt called in about fu from Dr Randa Evens about something she felt on her breast back in the summer. Please cb to further discuss

## 2023-01-05 ENCOUNTER — Encounter (INDEPENDENT_AMBULATORY_CARE_PROVIDER_SITE_OTHER): Payer: Self-pay | Admitting: Primary Care

## 2023-02-13 ENCOUNTER — Ambulatory Visit (INDEPENDENT_AMBULATORY_CARE_PROVIDER_SITE_OTHER): Payer: 59 | Admitting: Primary Care

## 2023-02-16 ENCOUNTER — Ambulatory Visit (INDEPENDENT_AMBULATORY_CARE_PROVIDER_SITE_OTHER): Payer: 59 | Admitting: Primary Care

## 2023-02-16 ENCOUNTER — Encounter (INDEPENDENT_AMBULATORY_CARE_PROVIDER_SITE_OTHER): Payer: Self-pay

## 2023-02-21 ENCOUNTER — Other Ambulatory Visit (INDEPENDENT_AMBULATORY_CARE_PROVIDER_SITE_OTHER): Payer: Self-pay | Admitting: Primary Care

## 2023-02-21 DIAGNOSIS — E559 Vitamin D deficiency, unspecified: Secondary | ICD-10-CM

## 2023-02-23 NOTE — Telephone Encounter (Signed)
Requested medication (s) are due for refill today: Due 02/28/23  Requested medication (s) are on the active medication list: yes    Last refill: 12/29/22  #8  0 refills  Future visit scheduled yes 12/28/23  Notes to clinic:not delegated, please review. Thank you.  Requested Prescriptions  Pending Prescriptions Disp Refills   Vitamin D, Ergocalciferol, (DRISDOL) 1.25 MG (50000 UNIT) CAPS capsule [Pharmacy Med Name: VITAMIN D2 50,000IU (ERGO) CAP RX] 8 capsule 0    Sig: TAKE 1 CAPSULE BY MOUTH 1 TIME A WEEK     Endocrinology:  Vitamins - Vitamin D Supplementation 2 Failed - 02/23/2023 12:05 PM      Failed - Manual Review: Route requests for 50,000 IU strength to the provider      Failed - Ca in normal range and within 360 days    Calcium  Date Value Ref Range Status  12/26/2022 11.1 (H) 8.7 - 10.2 mg/dL Final         Failed - Vitamin D in normal range and within 360 days    Vit D, 25-Hydroxy  Date Value Ref Range Status  12/26/2022 12.8 (L) 30.0 - 100.0 ng/mL Final    Comment:    Vitamin D deficiency has been defined by the Institute of Medicine and an Endocrine Society practice guideline as a level of serum 25-OH vitamin D less than 20 ng/mL (1,2). The Endocrine Society went on to further define vitamin D insufficiency as a level between 21 and 29 ng/mL (2). 1. IOM (Institute of Medicine). 2010. Dietary reference    intakes for calcium and D. [place]: The    [employer]. 2. Holick MF, Binkley [place], Bischoff-Ferrari HA, et al.    Evaluation, treatment, and prevention of vitamin D    deficiency: an Endocrine Society clinical practice    guideline. JCEM. 2011 Jul; 96(7):1911-30.          Passed - Valid encounter within last 12 months    Recent Outpatient Visits           1 month ago Physical exam, annual   [place] [facility] Grayce Sessions, NP   5 months ago Cervical cancer screening   [place] [facility]  Grayce Sessions, NP   8 months ago Vaginal irritation   [place] [facility] Grayce Sessions, NP   1 year ago Menorrhagia with irregular cycle   [place] [facility] Grayce Sessions, NP   1 year ago Class 2 severe obesity due to excess calories with serious comorbidity in adult, unspecified BMI (HCC)   [place] [facility] Grayce Sessions, NP       Future Appointments             In 10 months Randa Evens, Kinnie Scales, NP [place] [facility]

## 2023-07-27 ENCOUNTER — Encounter (INDEPENDENT_AMBULATORY_CARE_PROVIDER_SITE_OTHER): Payer: 59

## 2023-07-31 ENCOUNTER — Encounter (INDEPENDENT_AMBULATORY_CARE_PROVIDER_SITE_OTHER)

## 2023-07-31 ENCOUNTER — Telehealth (INDEPENDENT_AMBULATORY_CARE_PROVIDER_SITE_OTHER): Payer: Self-pay | Admitting: Primary Care

## 2023-07-31 NOTE — Telephone Encounter (Signed)
 Contacted pt  left vm to resch her tele appt for today. Appt cancel
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
## 2023-12-28 ENCOUNTER — Ambulatory Visit (INDEPENDENT_AMBULATORY_CARE_PROVIDER_SITE_OTHER): Payer: 59 | Admitting: Primary Care

## 2024-01-01 ENCOUNTER — Encounter (INDEPENDENT_AMBULATORY_CARE_PROVIDER_SITE_OTHER): Admitting: Primary Care

## 2024-01-05 ENCOUNTER — Ambulatory Visit (INDEPENDENT_AMBULATORY_CARE_PROVIDER_SITE_OTHER)

## 2024-01-05 ENCOUNTER — Encounter (INDEPENDENT_AMBULATORY_CARE_PROVIDER_SITE_OTHER): Payer: Self-pay

## 2024-01-05 VITALS — BP 112/63 | Ht 61.0 in | Wt 144.0 lb

## 2024-01-05 DIAGNOSIS — Z0000 Encounter for general adult medical examination without abnormal findings: Secondary | ICD-10-CM | POA: Diagnosis not present

## 2024-01-05 NOTE — Progress Notes (Signed)
 Because this visit was a virtual/telehealth visit,  certain criteria was not obtained, such a blood pressure, CBG if applicable, and timed get up and go. Any medications not marked as taking were not mentioned during the medication reconciliation part of the visit. Any vitals not documented were not able to be obtained due to this being a telehealth visit or patient was unable to self-report a recent blood pressure reading due to a lack of equipment at home via telehealth. Vitals that have been documented are verbally provided by the patient.  This visit was performed by a medical professional under my direct supervision. I was immediately available for consultation/collaboration. I have reviewed and agree with the Annual Wellness Visit documentation.  Subjective:   Monica Davidson is a 38 y.o. who presents for a Medicare Wellness preventive visit.  As a reminder, Annual Wellness Visits don't include a physical exam, and some assessments may be limited, especially if this visit is performed virtually. We may recommend an in-person follow-up visit with your provider if needed.  Visit Complete: Virtual I connected with  Malayah Zappulla on 01/05/24 by a audio enabled telemedicine application and verified that I am speaking with the correct person using two identifiers.  Patient Location: Home  Provider Location: Home Office  I discussed the limitations of evaluation and management by telemedicine. The patient expressed understanding and agreed to proceed.  Vital Signs: Because this visit was a virtual/telehealth visit, some criteria may be missing or patient reported. Any vitals not documented were not able to be obtained and vitals that have been documented are patient reported.  VideoDeclined- This patient declined Librarian, academic. Therefore the visit was completed with audio only.  Persons Participating in Visit: Patient.  AWV Questionnaire: Yes: Patient Medicare AWV  questionnaire was completed by the patient on 10/04/18/2023; I have confirmed that all information answered by patient is correct and no changes since this date.  Cardiac Risk Factors include: advanced age (>43men, >79 women)     Objective:    Today's Vitals   01/05/24 1600  BP: 112/63  Weight: 144 lb (65.3 kg)  Height: 5' 1 (1.549 m)   Body mass index is 27.21 kg/m.     07/21/2022   10:38 AM 03/12/2021    1:00 PM  Advanced Directives  Does Patient Have a Medical Advance Directive? No No  Would patient like information on creating a medical advance directive? No - Patient declined     Current Medications (verified) Outpatient Encounter Medications as of 01/05/2024  Medication Sig   Ascorbic Acid (VITAMIN C PO) Take by mouth.   ergocalciferol  (VITAMIN D2) 1.25 MG (50000 UT) capsule Take 1 capsule (50,000 Units total) by mouth once a week.   No facility-administered encounter medications on file as of 01/05/2024.    Allergies (verified) Patient has no known allergies.   History: Past Medical History:  Diagnosis Date   Anxiety    GERD (gastroesophageal reflux disease)    Past Surgical History:  Procedure Laterality Date   EYE SURGERY     FINGER SURGERY     Family History  Problem Relation Age of Onset   Heart attack Mother    Social History   Socioeconomic History   Marital status: Single    Spouse name: Not on file   Number of children: Not on file   Years of education: Not on file   Highest education level: Associate degree: academic program  Occupational History   Not on file  Tobacco Use   Smoking status: Former    Current packs/day: 0.00    Types: Cigarettes    Quit date: 09/27/2015    Years since quitting: 8.2   Smokeless tobacco: Never  Vaping Use   Vaping status: Never Used  Substance and Sexual Activity   Alcohol use: Not Currently   Drug use: No   Sexual activity: Yes    Birth control/protection: None, Condom  Other Topics Concern   Not  on file  Social History Narrative   Patient is currently in school working on her Associates Degree so 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 Chief Operating Officer and become a veterinary surgeon   Social Drivers of Health   Financial Resource Strain: Medium Risk (01/05/2024)   Overall Financial Resource Strain (CARDIA)    Difficulty of Paying Living Expenses: Somewhat hard  Food Insecurity: No Food Insecurity (01/05/2024)   Hunger Vital Sign    Worried About Running Out of Food in the Last Year: Never true    Ran Out of Food in the Last Year: Never true  Transportation Needs: No Transportation Needs (01/05/2024)   PRAPARE - Administrator, Civil Service (Medical): No    Lack of Transportation (Non-Medical): No  Physical Activity: Sufficiently Active (01/05/2024)   Exercise Vital Sign    Days of Exercise per Week: 7 days    Minutes of Exercise per Session: 30 min  Stress: No Stress Concern Present (01/05/2024)   [employer] of Occupational Health - Occupational Stress Questionnaire    Feeling of Stress: Not at all  Social Connections: Moderately Integrated (01/05/2024)   Social Connection and Isolation Panel    Frequency of Communication with Friends and Family: More than three times a week    Frequency of Social Gatherings with Friends and Family: Three times a week    Attends Religious Services: 1 to 4 times per year    Active Member of Clubs or Organizations: Yes    Attends Engineer, Structural: More than 4 times per year    Marital Status: Never married    Tobacco Counseling Counseling given: Not Answered    Clinical Intake:  Pre-visit preparation completed: Yes  Pain : No/denies pain     Nutritional Status: BMI 25 -29 Overweight Nutritional Risks: None Diabetes: No  Lab Results  Component Value Date   HGBA1C 5.4 10/11/2020     How often do you need to have someone help you when you read instructions, pamphlets, or other written materials from your doctor or pharmacy?: 1 -  Never  Interpreter Needed?: No  Information entered by :: Jacquette Canales,cma   Activities of Daily Living     01/05/2024    3:53 PM  In your present state of health, do you have any difficulty performing the following activities:  Hearing? 0  Vision? 0  Difficulty concentrating or making decisions? 0  Walking or climbing stairs? 0  Dressing or bathing? 0  Doing errands, shopping? 0  Preparing Food and eating ? N  Using the Toilet? N  In the past six months, have you accidently leaked urine? N  Do you have problems with loss of bowel control? Y  Managing your Medications? N  Managing your Finances? Y  Housekeeping or managing your Housekeeping? Y    Patient Care Team: Celestia Rosaline SQUIBB, NP as PCP - General (Internal Medicine)  I have updated your Care Teams any recent Medical Services you may have received from other providers in the past year.  Assessment:   This is a routine wellness examination for Kayda.  Hearing/Vision screen Hearing Screening - Comments:: No difficulties Vision Screening - Comments:: Patient wears glasses   Goals Addressed             This Visit's Progress    Patient Stated   On track    Patient states her goal is to start going to the gym and continue to drink water.      Patient Stated       To finish school       Depression Screen     01/05/2024    4:04 PM 12/26/2022    9:17 AM 09/23/2022   12:06 PM 07/21/2022   10:29 AM 06/18/2022    4:46 PM 10/11/2021   10:40 AM 05/24/2021   11:26 AM  PHQ 2/9 Scores  PHQ - 2 Score 0 0 0 0 0 0 0  PHQ- 9 Score 0 1  0 0 0     Fall Risk     01/05/2024    3:53 PM 12/26/2022    9:17 AM 09/23/2022   12:06 PM 07/21/2022   10:38 AM 07/17/2022    9:48 AM  Fall Risk   Falls in the past year? 0 1 0 0 0  Number falls in past yr: 0 0 0 0 0  Injury with Fall? 0 0 0 0 0  Risk for fall due to : History of fall(s)  No Fall Risks No Fall Risks   Follow up Falls evaluation completed   Falls  prevention discussed     MEDICARE RISK AT HOME:  Medicare Risk at Home Any stairs in or around the home?: (Patient-Rptd) Yes If so, are there any without handrails?: (Patient-Rptd) No Home free of loose throw rugs in walkways, pet beds, electrical cords, etc?: (Patient-Rptd) No Adequate lighting in your home to reduce risk of falls?: (Patient-Rptd) Yes Life alert?: (Patient-Rptd) No Use of a cane, walker or w/c?: (Patient-Rptd) No Grab bars in the bathroom?: (Patient-Rptd) No Shower chair or bench in shower?: (Patient-Rptd) No Elevated toilet seat or a handicapped toilet?: (Patient-Rptd) No  TIMED UP AND GO:  Was the test performed?  No  Cognitive Function: 6CIT completed        01/05/2024    4:05 PM 07/21/2022   10:44 AM 05/24/2021   11:23 AM  6CIT Screen  What Year? 0 points 0 points 0 points  What month? 0 points 0 points 0 points  What time? 0 points 0 points 0 points  Count back from 20 0 points 0 points 0 points  Months in reverse 0 points 0 points 0 points  Repeat phrase 0 points 0 points 0 points  Total Score 0 points 0 points 0 points    Immunizations Immunization History  Administered Date(s) Administered   HPV 9-valent 12/27/2018   Tdap 09/27/2018    Screening Tests Health Maintenance  Topic Date Due   Hepatitis B Vaccines 19-59 Average Risk (1 of 3 - 19+ 3-dose series) Never done   HPV VACCINES (2 - 3-dose SCDM series) 01/24/2019   Influenza Vaccine  Never done   COVID-19 Vaccine (1 - 2025-26 season) Never done   Medicare Annual Wellness (AWV)  01/04/2025   Cervical Cancer Screening (HPV/Pap Cotest)  09/23/2027   DTaP/Tdap/Td (2 - Td or Tdap) 09/26/2028   Hepatitis C Screening  Completed   HIV Screening  Completed   Pneumococcal Vaccine  Aged Out   Meningococcal B Vaccine  Aged  Out    Health Maintenance Items Addressed:patient declined  Additional Screening:  Vision Screening: Recommended annual ophthalmology exams for early detection of  glaucoma and other disorders of the eye. Is the patient up to date with their annual eye exam?  No    Dental Screening: Recommended annual dental exams for proper oral hygiene  Community Resource Referral / Chronic Care Management: CRR required this visit?  No   CCM required this visit?  No   Plan:    I have personally reviewed and noted the following in the patient's chart:   Medical and social history Use of alcohol, tobacco or illicit drugs  Current medications and supplements including opioid prescriptions. Patient is not currently taking opioid prescriptions. Functional ability and status Nutritional status Physical activity Advanced directives List of other physicians Hospitalizations, surgeries, and ER visits in previous 12 months Vitals Screenings to include cognitive, depression, and falls Referrals and appointments  In addition, I have reviewed and discussed with patient certain preventive protocols, quality metrics, and best practice recommendations. A written personalized care plan for preventive services as well as general preventive health recommendations were provided to patient.   Lyle MARLA Right, [place]   01/05/2024   After Visit Summary: (MyChart) Due to this being a telephonic visit, the after visit summary with patients personalized plan was offered to patient via MyChart   Notes: Nothing significant to report at this time.

## 2024-01-05 NOTE — Patient Instructions (Signed)
 [name],  Thank you for taking the time for your Medicare Wellness Visit. I appreciate your continued commitment to your health goals. Please review the care plan we discussed, and feel free to reach out if I can assist you further.  Medicare recommends these wellness visits once per year to help you and your care team stay ahead of potential health issues. These visits are designed to focus on prevention, allowing your provider to concentrate on managing your acute and chronic conditions during your regular appointments.  Please note that Annual Wellness Visits do not include a physical exam. Some assessments may be limited, especially if the visit was conducted virtually. If needed, we may recommend a separate in-person follow-up with your provider.  Ongoing Care Seeing your primary care provider every 3 to 6 months helps us  monitor your health and provide consistent, personalized care.   Referrals If a referral was made during today's visit and you haven't received any updates within two weeks, please contact the referred provider directly to check on the status.  Recommended Screenings:  Health Maintenance  Topic Date Due   Hepatitis B Vaccine (1 of 3 - 19+ 3-dose series) Never done   HPV Vaccine (2 - 3-dose SCDM series) 01/24/2019   Flu Shot  Never done   COVID-19 Vaccine (1 - 2025-26 season) Never done   Medicare Annual Wellness Visit  01/04/2025   Pap with HPV screening  09/23/2027   DTaP/Tdap/Td vaccine (2 - Td or Tdap) 09/26/2028   Hepatitis C Screening  Completed   HIV Screening  Completed   Pneumococcal Vaccine  Aged Out   Meningitis B Vaccine  Aged Out       07/21/2022   10:38 AM  Advanced Directives  Does Patient Have a Medical Advance Directive? No  Would patient like information on creating a medical advance directive? No - Patient declined   Advance Care Planning is important because it: Ensures you receive medical care that aligns with your values, goals, and  preferences. Provides guidance to your family and loved ones, reducing the emotional burden of decision-making during critical moments.  Vision: Annual vision screenings are recommended for early detection of glaucoma, cataracts, and diabetic retinopathy. These exams can also reveal signs of chronic conditions such as diabetes and high blood pressure.  Dental: Annual dental screenings help detect early signs of oral cancer, gum disease, and other conditions linked to overall health, including heart disease and diabetes.  Please see the attached documents for additional preventive care recommendations.

## 2024-01-07 ENCOUNTER — Encounter

## 2024-01-20 ENCOUNTER — Encounter (INDEPENDENT_AMBULATORY_CARE_PROVIDER_SITE_OTHER): Admitting: Primary Care

## 2024-01-20 ENCOUNTER — Encounter (INDEPENDENT_AMBULATORY_CARE_PROVIDER_SITE_OTHER): Payer: Self-pay

## 2024-01-25 ENCOUNTER — Other Ambulatory Visit (INDEPENDENT_AMBULATORY_CARE_PROVIDER_SITE_OTHER): Payer: Self-pay | Admitting: Primary Care

## 2024-01-25 NOTE — Progress Notes (Signed)
I have reviewed the documentation in this encounter.  I agree with recommendations documented.

## 2024-02-15 ENCOUNTER — Encounter (INDEPENDENT_AMBULATORY_CARE_PROVIDER_SITE_OTHER): Payer: Self-pay | Admitting: Primary Care

## 2024-02-15 ENCOUNTER — Ambulatory Visit (INDEPENDENT_AMBULATORY_CARE_PROVIDER_SITE_OTHER): Admitting: Primary Care

## 2024-02-15 VITALS — BP 111/73 | HR 60 | Resp 16 | Ht 61.0 in | Wt 144.4 lb

## 2024-02-15 DIAGNOSIS — Z0000 Encounter for general adult medical examination without abnormal findings: Secondary | ICD-10-CM

## 2024-02-15 NOTE — Progress Notes (Unsigned)
 Complete physical exam  Patient: Monica Davidson   DOB: 1985/03/31   38 y.o. Female  MRN: [id number]  Subjective:       Monica Davidson is a 38 y.o. female who presents today for a complete physical exam. She reports consuming a general diet. Gym/ health club routine includes cardio, stair stepper , treadmill, and walking on track . Exercise is limited by n/a. She generally feels well. She reports sleeping fairly well. She does not have additional problems to discuss today.    Most recent fall risk assessment:    01/05/2024    3:53 PM  Fall Risk   Falls in the past year? 0  Number falls in past yr: 0  Injury with Fall? 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   ergocalciferol  (VITAMIN D2) 1.25 MG (50000 UT) capsule Take 1 capsule (50,000 Units total) by mouth once a week.   No facility-administered medications prior to visit.    Review of Systems  All other systems reviewed and are negative.      Objective:     BP 111/73   Pulse 60   Resp 16   Ht 5' 1 (1.549 m)   Wt 144 lb 6.4 oz (65.5 kg)   SpO2  97%   BMI 27.28 kg/m   Physical Exam Vitals reviewed.  Constitutional:      Appearance: Normal appearance.  HENT:     Head: Normocephalic.     Right Ear: Tympanic membrane, ear canal and external ear normal.     Left Ear: Tympanic membrane, ear canal and external ear normal.     Nose: Nose normal.     Mouth/Throat:     Mouth: Mucous membranes are moist.  Eyes:     Extraocular Movements: Extraocular movements intact.     Pupils: Pupils are equal, round, and reactive to light.  Cardiovascular:     Rate and Rhythm: Normal rate.  Pulmonary:     Effort: Pulmonary effort is normal.     Breath sounds: Normal breath sounds.  Abdominal:     General: Bowel sounds are normal.     Palpations: Abdomen is soft.  Musculoskeletal:        General: Normal range of motion.     Cervical back: Normal range of motion.  Skin:    General: Skin is warm and dry.  Neurological:  Mental Status: She is alert and oriented to person, place, and time.  Psychiatric:        Mood and Affect: Mood normal.        Behavior: Behavior normal.        Thought Content: Thought content normal.     No results found for any visits on 02/15/24.    Assessment & Plan:  Diagnoses and all orders for this visit:  Physical exam, annual Routine Health Maintenance and Physical Exam  Immunization History  Administered Date(s) Administered   HPV 9-valent 12/27/2018   Tdap 09/27/2018    Health Maintenance  Topic Date Due   Hepatitis B Vaccines 19-59 Average Risk (1 of 3 - 19+ 3-dose series) Never done   HPV VACCINES (2 - 3-dose SCDM series) 01/24/2019   COVID-19 Vaccine (1 - 2025-26 season) Never done   Influenza Vaccine  06/07/2024 (Originally 10/09/2023)   Medicare Annual Wellness (AWV)  01/04/2025   Cervical Cancer Screening (HPV/Pap Cotest)  09/23/2027   DTaP/Tdap/Td (2 - Td or Tdap) 09/26/2028   Hepatitis C Screening  Completed   HIV Screening  Completed   Pneumococcal Vaccine  Aged Out   Meningococcal  B Vaccine  Aged Out    Discussed health benefits of physical activity, and encouraged her to engage in regular exercise appropriate for her age and condition.  [name] was seen today for annual exam.  Diagnoses and all orders for this visit:  Physical exam, annual completed    Return in about 6 months (around 08/15/2024).     Monica SHAUNNA Bohr, NP

## 2024-03-16 ENCOUNTER — Telehealth (INDEPENDENT_AMBULATORY_CARE_PROVIDER_SITE_OTHER): Payer: Self-pay | Admitting: Primary Care

## 2024-03-16 NOTE — Telephone Encounter (Signed)
 Will forward to provider

## 2024-03-16 NOTE — Telephone Encounter (Signed)
 Copied from CRM #[id number]. Topic: Clinical - Medical Advice >> Mar 16, 2024 11:56 AM Burnard DEL wrote: Reason for CRM: Patient called in stating that Dr Celestia told her after her last appointment that she would consult her personal OBGYN to ask her about some medication for her. She would like to know if she has been able to obtain this information from her OBGYN,and was she able to get the medication for her that she suggested? Please advise.

## 2024-03-17 ENCOUNTER — Other Ambulatory Visit (INDEPENDENT_AMBULATORY_CARE_PROVIDER_SITE_OTHER): Payer: Self-pay | Admitting: Primary Care

## 2024-03-17 MED ORDER — METRONIDAZOLE 0.75 % VA GEL: VAGINAL | 1 refills | Status: AC

## 2024-03-25 NOTE — Telephone Encounter (Signed)
 Will forward to provider

## 2024-04-13 ENCOUNTER — Other Ambulatory Visit (INDEPENDENT_AMBULATORY_CARE_PROVIDER_SITE_OTHER): Payer: Self-pay | Admitting: Primary Care

## 2024-04-13 ENCOUNTER — Encounter (INDEPENDENT_AMBULATORY_CARE_PROVIDER_SITE_OTHER): Payer: Self-pay | Admitting: Primary Care

## 2024-04-13 ENCOUNTER — Ambulatory Visit (INDEPENDENT_AMBULATORY_CARE_PROVIDER_SITE_OTHER): Admitting: Primary Care

## 2024-04-13 VITALS — BP 133/78 | HR 69 | Temp 98.2°F | Resp 16 | Ht 61.0 in | Wt 144.8 lb

## 2024-04-13 DIAGNOSIS — N6314 Unspecified lump in the right breast, lower inner quadrant: Secondary | ICD-10-CM

## 2024-04-13 NOTE — Progress Notes (Unsigned)
 \ [facility]  Monica Davidson, is a 39 y.o. female  RDW:[id number]  FMW:[id number]  DOB - 01-24-1986  Chief Complaint  Patient presents with   Breast Mass       Subjective:   Monica Davidson is a 39 y.o. female here today for an acute visit.  Patient is in today concerned about a rash that she felt in her right breast found on yesterday.  Patient called added her as a walk-in there is significant changes on the right breast below the alveolare there is a small dry patch skin at 5-6 o'clock.  The mass is 7-9 o'clock with some dimpling.  Nontender to touch but feels heavy.  Will order diagnostic tests rule out any abnormalities. Patient showed how to do SBE verbalized understanding.  HPI  No problems updated.  Comprehensive ROS Pertinent positive and negative noted in HPI   Allergies[1]  Past Medical History:  Diagnosis Date   Anxiety    GERD (gastroesophageal reflux disease)     Medications Ordered Prior to Encounter[2] Health Maintenance  Topic Date Due   Hepatitis B Vaccine (1 of 3 - 19+ 3-dose series) Never done   HPV Vaccine (2 - 3-dose SCDM series) 01/24/2019   COVID-19 Vaccine (1 - 2025-26 season) Never done   Flu Shot  06/07/2024*   Medicare Annual Wellness Visit  01/04/2025   Pap with HPV screening  09/23/2027   DTaP/Tdap/Td vaccine (2 - Td or Tdap) 09/26/2028   Hepatitis C Screening  Completed   HIV Screening  Completed   Pneumococcal Vaccine  Aged Out   Meningitis B Vaccine  Aged Out  *Topic was postponed. The date shown is not the original due date.    Objective:   Vitals:   04/13/24 1441  BP: 133/78  Pulse: 69  Resp: 16  Temp: 98.2 F (36.8 C)  SpO2: 100%  Weight: 144 lb 12.8 oz (65.7 kg)  Height: 5' 1 (1.549 m)   {Vitals History (Optional):23777}  Physical Exam Vitals reviewed.  Constitutional:      Appearance: Normal appearance. She is obese.  HENT:     Head: Normocephalic.     Right Ear: Tympanic membrane, ear canal and  external ear normal.     Left Ear: Tympanic membrane, ear canal and external ear normal.     Nose: Nose normal.     Mouth/Throat:     Mouth: Mucous membranes are moist.  Eyes:     Extraocular Movements: Extraocular movements intact.     Pupils: Pupils are equal, round, and reactive to light.  Cardiovascular:     Rate and Rhythm: Normal rate.  Pulmonary:     Effort: Pulmonary effort is normal.     Breath sounds: Normal breath sounds.  Abdominal:     General: Bowel sounds are normal.     Palpations: Abdomen is soft.  Musculoskeletal:        General: Normal range of motion.     Cervical back: Normal range of motion.  Skin:    General: Skin is warm and dry.  Neurological:     Mental Status: She is alert and oriented to person, place, and time.  Psychiatric:        Mood and Affect: Mood normal.        Behavior: Behavior normal.        Thought Content: Thought content normal.     {Labs (Optional):23779}  Assessment & Plan   Greenleigh was seen today for breast mass.  Diagnoses  and all orders for this visit:  Mass of lower inner quadrant of right breast      -     MM 3D DIAGNOSTIC MAMMOGRAM UNILATERAL RIGHT BREAST; Future     Patient have been counseled extensively about nutrition and exercise. Other issues discussed during this visit include: low cholesterol diet, weight control and daily exercise, foot care, annual eye examinations at Ophthalmology, importance of adherence with medications and regular follow-up. We also discussed long term complications of uncontrolled diabetes and hypertension.   No follow-ups on file.  The patient was given clear instructions to go to ER or return to medical center if symptoms don't improve, worsen or new problems develop. The patient verbalized understanding. The patient was told to call to get lab results if they haven't heard anything in the next week.   This note has been created with Transport planner. Any transcriptional errors are unintentional.   [name], NP 04/13/2024, 3:29 PM    [1] No Known Allergies [2]  Current Outpatient Medications on File Prior to Visit  Medication Sig Dispense Refill   Ascorbic Acid (VITAMIN C PO) Take by mouth.     ergocalciferol  (VITAMIN D2) 1.25 MG (50000 UT) capsule Take 1 capsule (50,000 Units total) by mouth once a week. 8 capsule 0   metroNIDAZOLE  (METROGEL ) 0.75 % vaginal gel Take twice weekly for 3 months 280 g 1   No current facility-administered medications on file prior to visit.

## 2024-04-21 ENCOUNTER — Other Ambulatory Visit

## 2024-04-21 ENCOUNTER — Encounter
